# Patient Record
Sex: Male | Born: 1975 | Hispanic: No | Marital: Married | Smoking: Never smoker
Health system: Southern US, Community
[De-identification: ages and names within clinical notes are randomized; demographics above are authoritative.]

---

## 2010-03-31 ENCOUNTER — Ambulatory Visit: Payer: Self-pay | Admitting: Gastroenterology

## 2010-04-04 ENCOUNTER — Observation Stay: Payer: Self-pay | Admitting: Surgery

## 2012-03-05 DIAGNOSIS — I10 Essential (primary) hypertension: Secondary | ICD-10-CM | POA: Insufficient documentation

## 2012-03-05 DIAGNOSIS — L409 Psoriasis, unspecified: Secondary | ICD-10-CM | POA: Insufficient documentation

## 2012-09-12 DIAGNOSIS — M549 Dorsalgia, unspecified: Secondary | ICD-10-CM | POA: Insufficient documentation

## 2014-10-22 ENCOUNTER — Ambulatory Visit: Payer: Self-pay

## 2015-10-12 ENCOUNTER — Other Ambulatory Visit: Payer: Self-pay | Admitting: Infectious Diseases

## 2015-10-12 DIAGNOSIS — B181 Chronic viral hepatitis B without delta-agent: Secondary | ICD-10-CM

## 2015-10-15 ENCOUNTER — Ambulatory Visit
Admission: RE | Admit: 2015-10-15 | Discharge: 2015-10-15 | Disposition: A | Discharge status: Home / Self Care | Payer: 59 | Source: Ambulatory Visit | Attending: Infectious Diseases | Admitting: Infectious Diseases

## 2015-10-15 DIAGNOSIS — B181 Chronic viral hepatitis B without delta-agent: Secondary | ICD-10-CM | POA: Diagnosis present

## 2016-06-17 IMAGING — US US ABDOMEN COMPLETE
1 series · 13 of 25 positions shown · non-contrast
Comparison: Abdominal aortic ultrasound October 22, 2014

CLINICAL DATA: Chronic hepatitis B

EXAM:
ULTRASOUND ABDOMEN COMPLETE

[Series 1: us abdomen complete · 0.20mm/px · 13 of 80 slices shown]
[im 1/80]
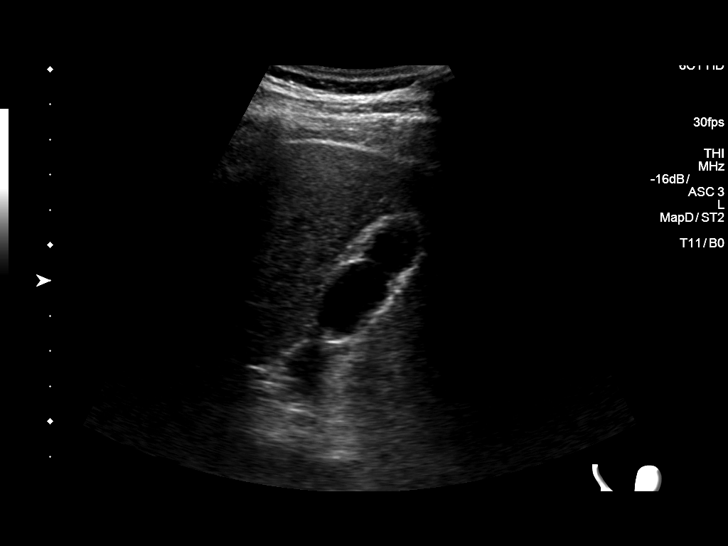
[im 7/80]
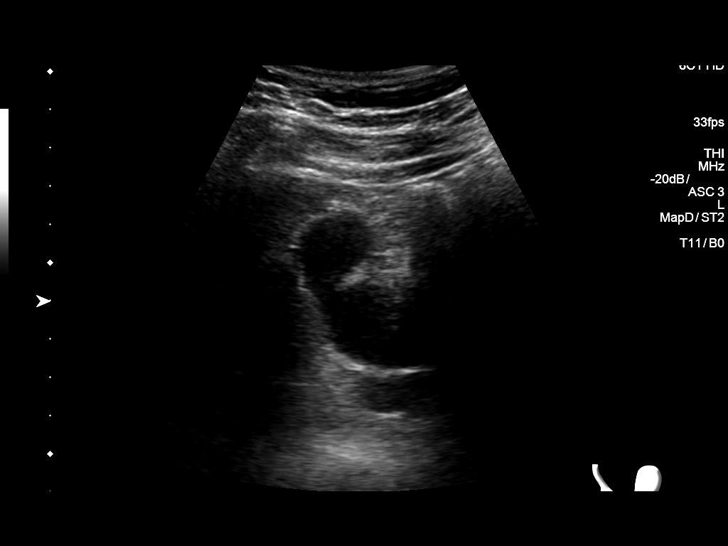
[im 14/80]
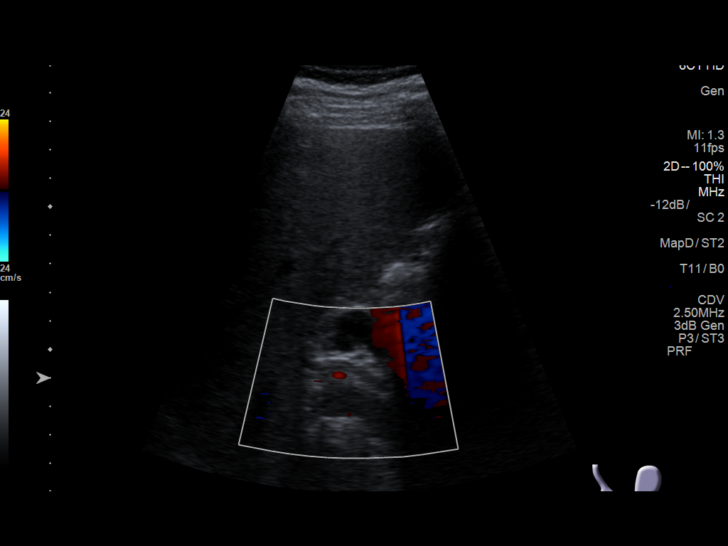
[im 20/80]
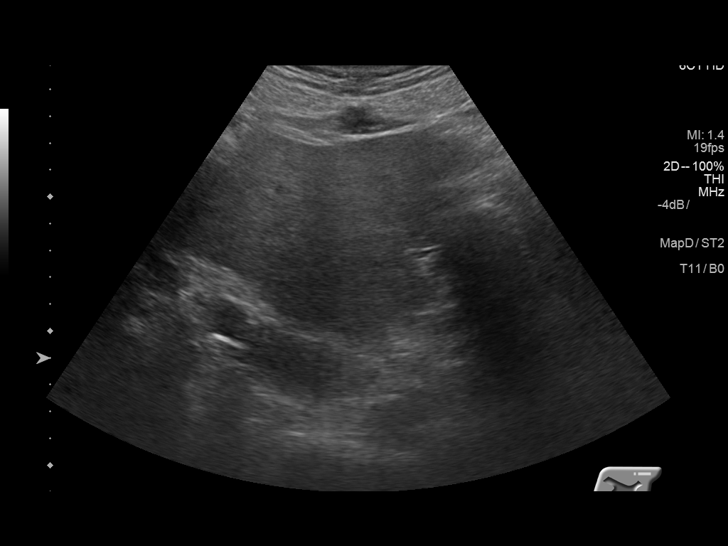
[im 27/80]
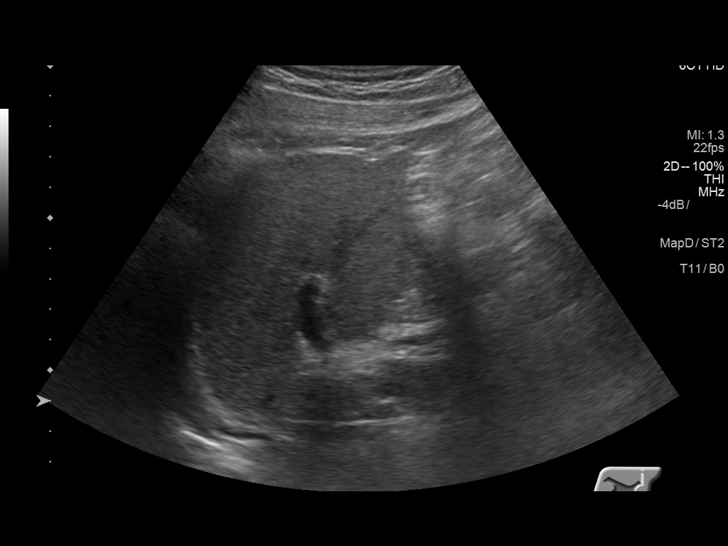
[im 33/80]
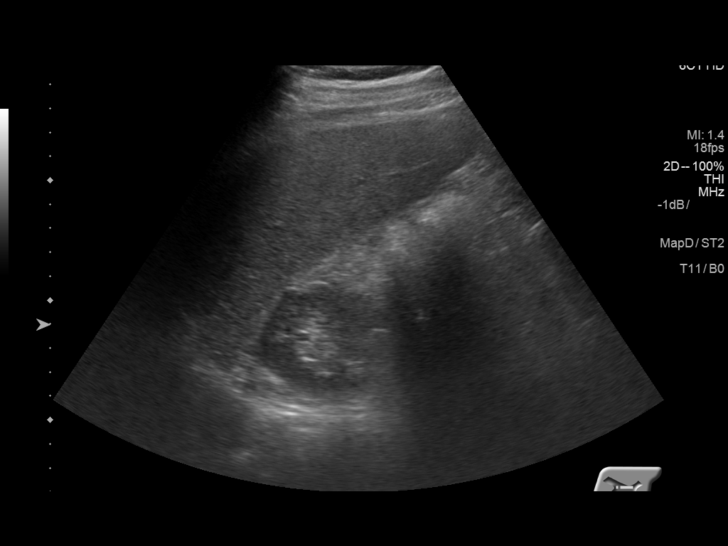
[im 40/80]
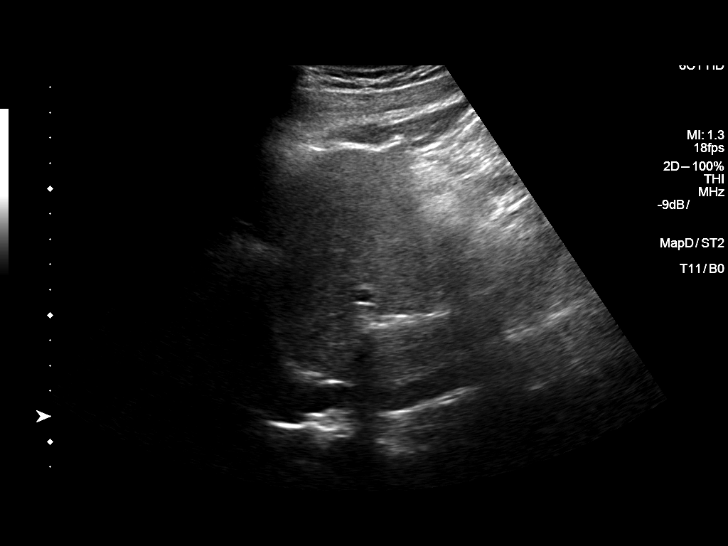
[im 47/80]
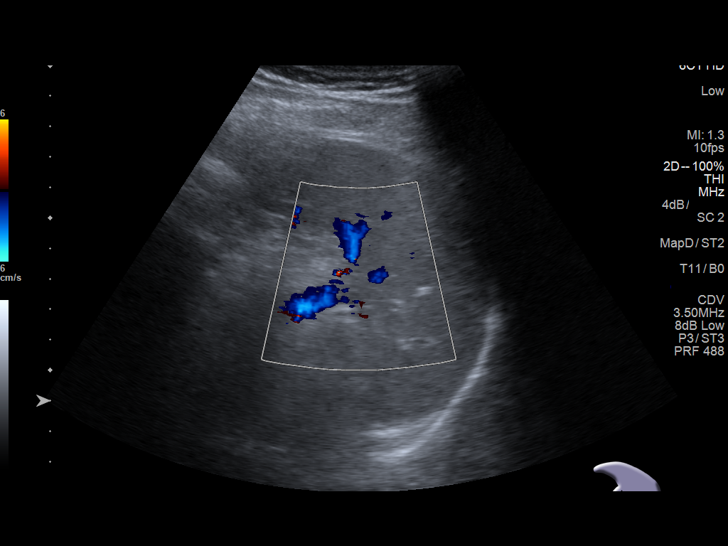
[im 53/80]
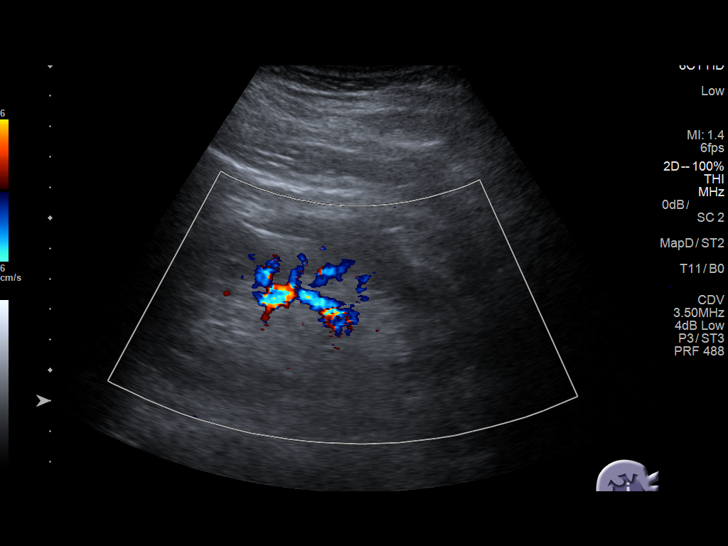
[im 60/80]
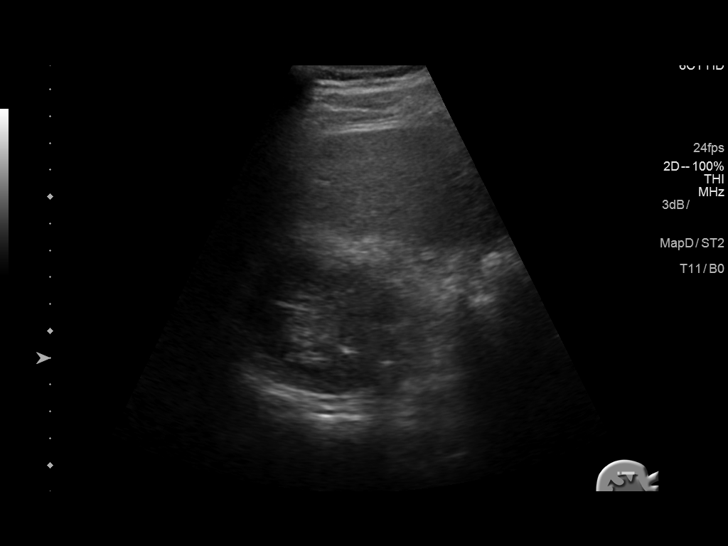
[im 66/80]
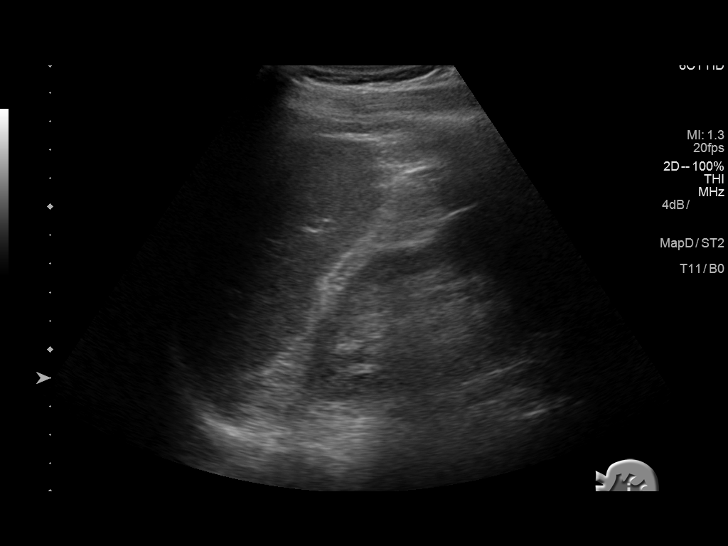
[im 73/80]
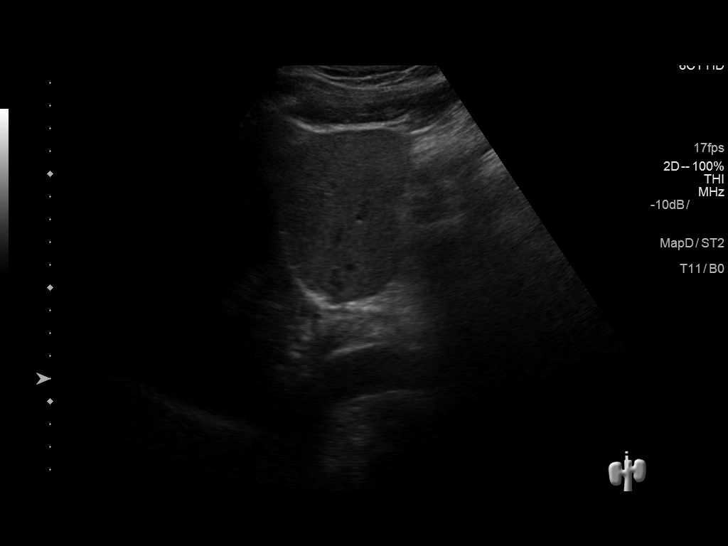
[im 80/80]
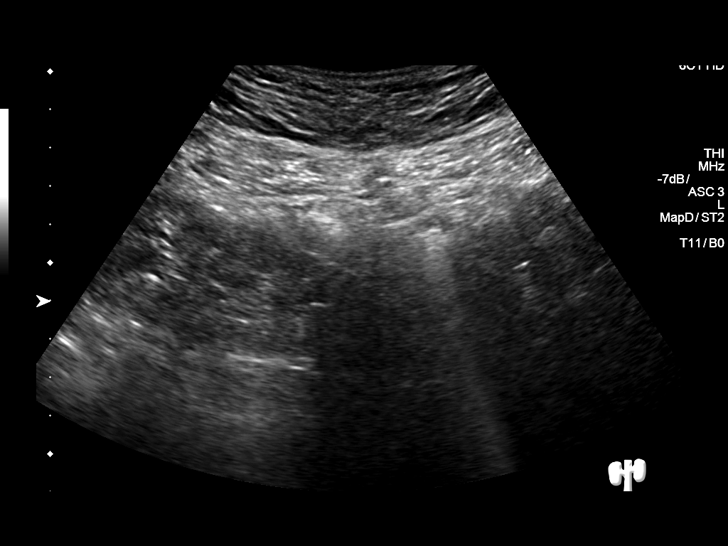

[13 of 25 positions shown; findings below may reference images not displayed]

FINDINGS: Gallbladder: The gallbladder is adequately distended. There are
multiple folds. There is no gallbladder wall thickening,
pericholecystic fluid, or positive sonographic Murphy's sign.

Common bile duct: Diameter: 3.2 mm

Liver: The liver exhibits increased echotexture with no focal mass
nor ductal dilation. The surface contour of the liver remains
smooth.

IVC: No abnormality visualized.

Pancreas: Evaluation of the pancreas was markedly limited due to
bowel gas.

Spleen: Size and appearance within normal limits.

Right Kidney: Length: 11.1 cm. Echogenicity within normal limits. No
mass or hydronephrosis visualized.

Left Kidney: Length: 11.8 cm. Echogenicity within normal limits. No
mass or hydronephrosis visualized. A previously demonstrated upper
pole cyst on the left is not evident today.

Abdominal aorta: Bowel gas limits visualization of portions of the
abdominal aorta. No aneurysm is demonstrated.

Other findings: There is no ascites.
IMPRESSION: 1. Increased hepatic echotexture consistent with fatty infiltrative
change. No hepatic mass is observed.
2. No acute gallbladder or splenic or renal pathology.
3. Limited visualization of the pancreas and abdominal aorta.

## 2016-11-03 ENCOUNTER — Other Ambulatory Visit: Payer: Self-pay | Admitting: Infectious Diseases

## 2016-11-03 DIAGNOSIS — B181 Chronic viral hepatitis B without delta-agent: Secondary | ICD-10-CM

## 2016-11-09 ENCOUNTER — Ambulatory Visit
Admission: RE | Admit: 2016-11-09 | Discharge: 2016-11-09 | Disposition: A | Discharge status: Home / Self Care | Payer: 59 | Source: Ambulatory Visit | Attending: Infectious Diseases | Admitting: Infectious Diseases

## 2016-11-09 DIAGNOSIS — B181 Chronic viral hepatitis B without delta-agent: Secondary | ICD-10-CM | POA: Diagnosis not present

## 2016-11-09 DIAGNOSIS — K760 Fatty (change of) liver, not elsewhere classified: Secondary | ICD-10-CM | POA: Diagnosis not present

## 2017-07-13 IMAGING — US US ABDOMEN LIMITED
1 series · 14 of 25 positions shown · non-contrast
Comparison: 10/15/2015

CLINICAL DATA: Chronic hepatitis-B.  Evaluate for cirrhosis.

EXAM:
US ABDOMEN LIMITED - RIGHT UPPER QUADRANT

[Series 1: us abdomen limited · 0.22mm/px · 14 of 36 slices shown]
[im 1/36]
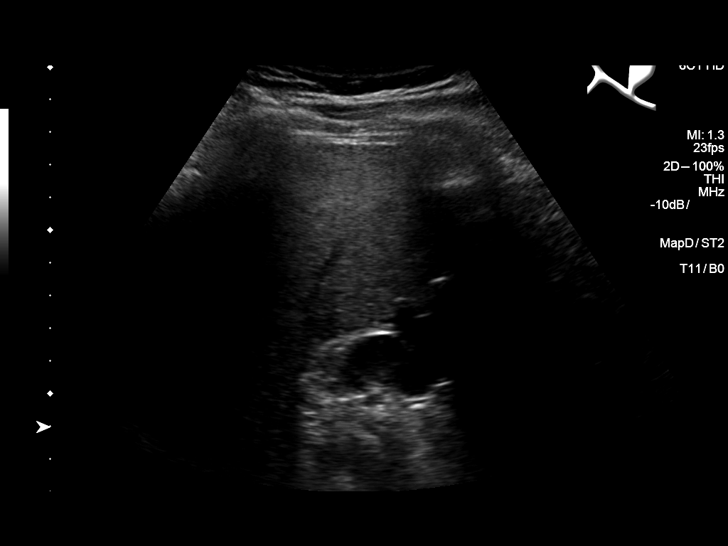
[im 3/36]
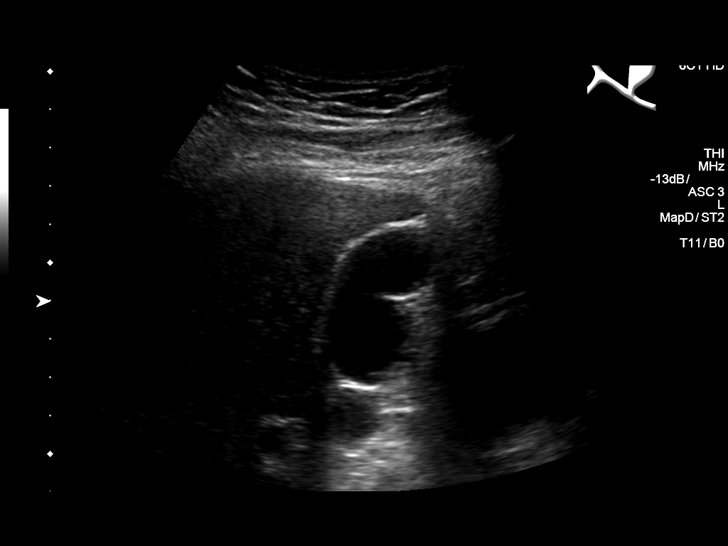
[im 6/36]
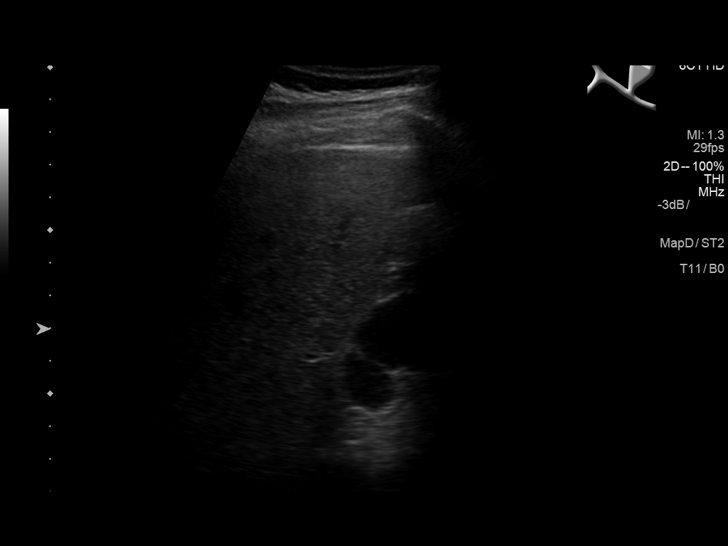
[im 9/36]
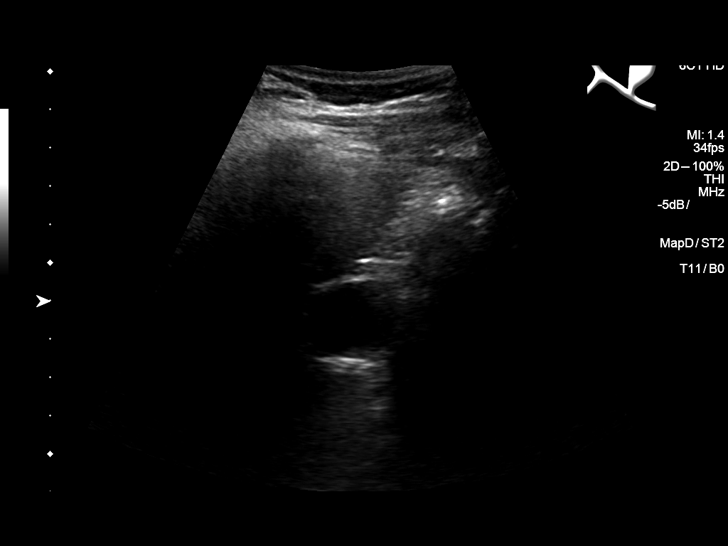
[im 12/36]
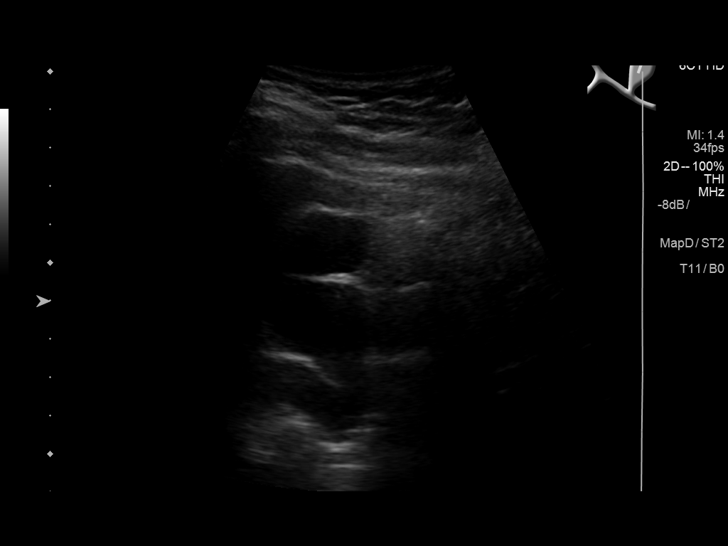
[im 14/36]
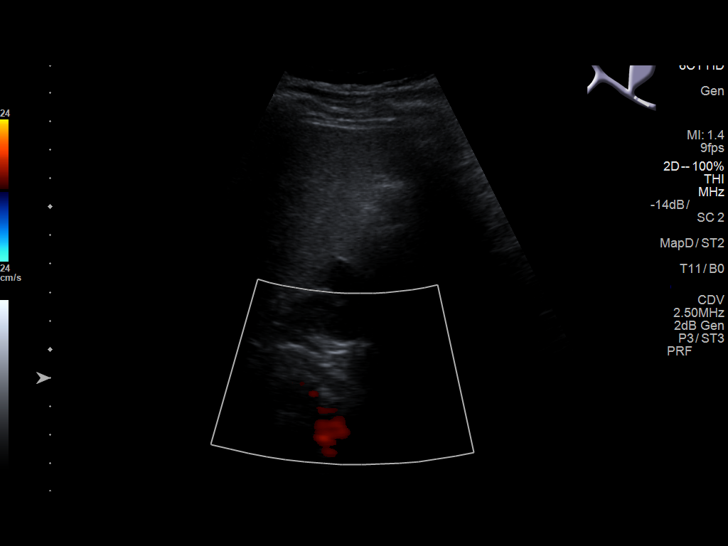
[im 17/36]
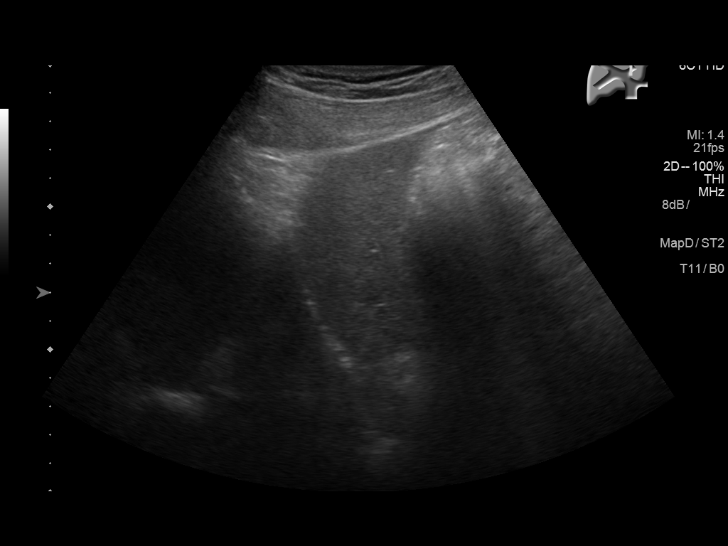
[im 19/36]
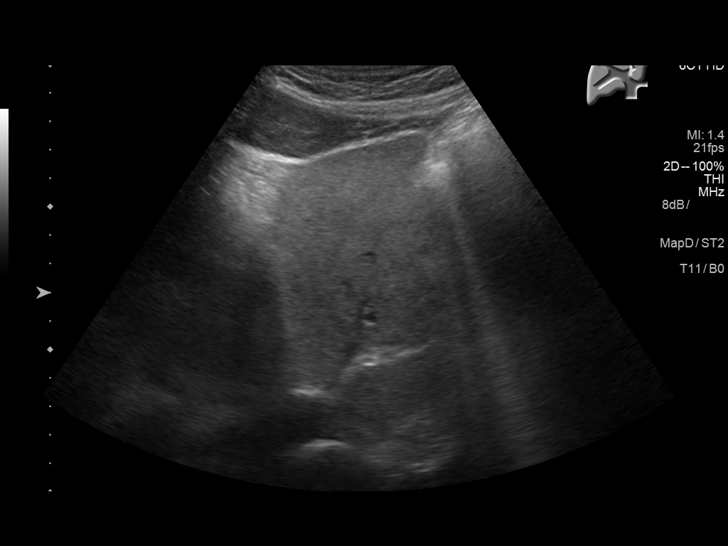
[im 22/36]
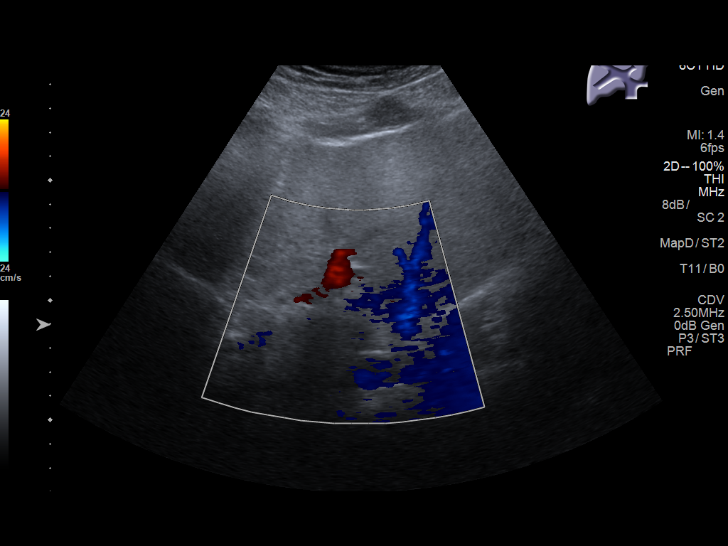
[im 24/36]
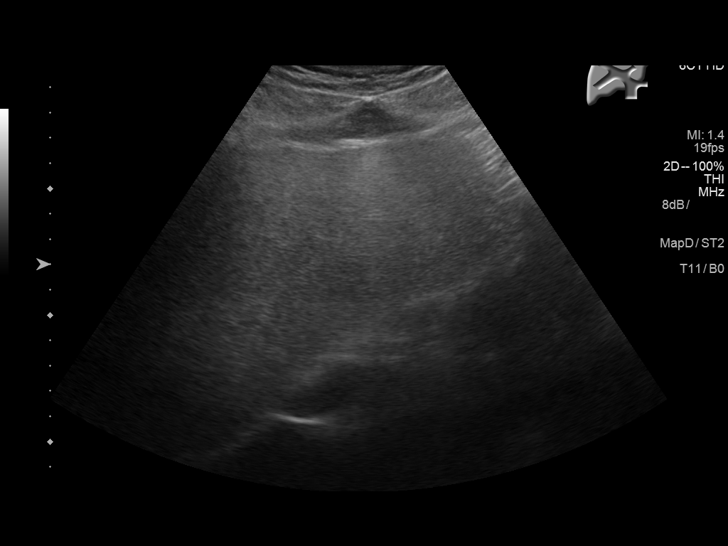
[im 27/36]
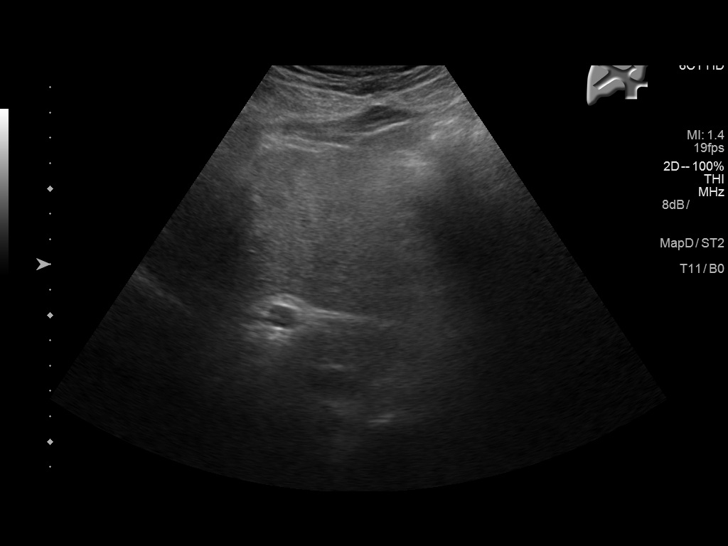
[im 30/36]
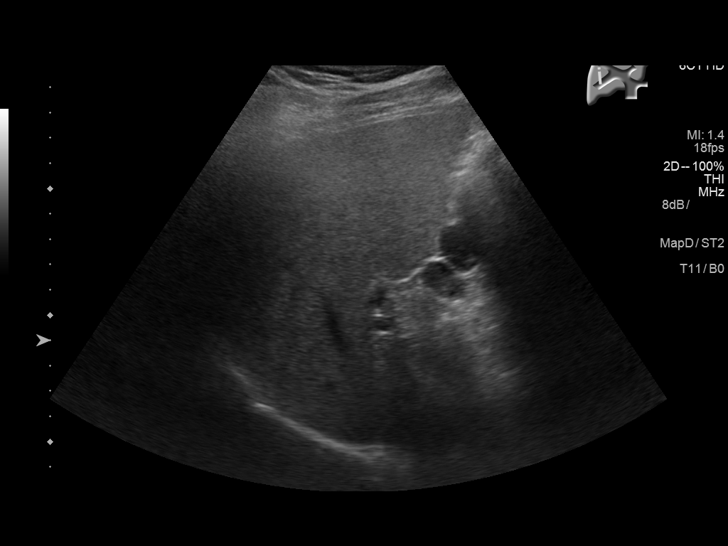
[im 33/36]
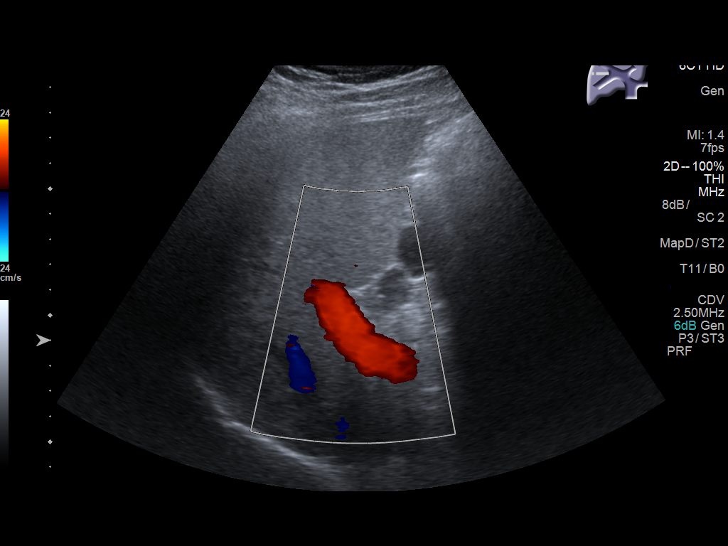
[im 36/36]
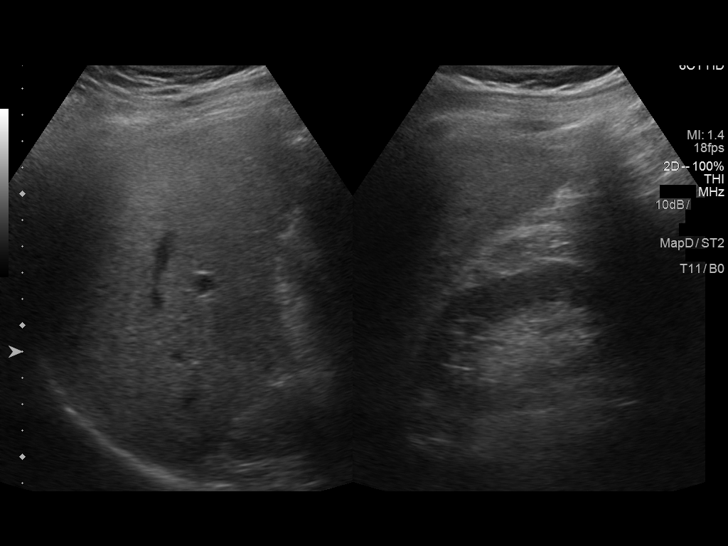

[14 of 25 positions shown; findings below may reference images not displayed]

FINDINGS: Gallbladder:

No gallstones or wall thickening visualized. No sonographic Murphy
sign noted by sonographer.

Common bile duct:

Diameter: Normal caliber, 5 mm

Liver:

Increased echotexture compatible with fatty infiltration. No focal
abnormality or biliary ductal dilatation.
IMPRESSION: Fatty infiltration of the liver.  No focal abnormality.

## 2017-11-16 ENCOUNTER — Other Ambulatory Visit: Payer: Self-pay | Admitting: Infectious Diseases

## 2017-11-16 DIAGNOSIS — D181 Lymphangioma, any site: Secondary | ICD-10-CM

## 2017-12-05 ENCOUNTER — Other Ambulatory Visit: Payer: Self-pay | Admitting: Infectious Diseases

## 2017-12-05 DIAGNOSIS — B181 Chronic viral hepatitis B without delta-agent: Secondary | ICD-10-CM

## 2017-12-07 ENCOUNTER — Ambulatory Visit
Admission: RE | Admit: 2017-12-07 | Discharge: 2017-12-07 | Disposition: A | Discharge status: Home / Self Care | Payer: Managed Care, Other (non HMO) | Source: Ambulatory Visit | Attending: Infectious Diseases | Admitting: Infectious Diseases

## 2017-12-07 DIAGNOSIS — B181 Chronic viral hepatitis B without delta-agent: Secondary | ICD-10-CM

## 2018-08-10 IMAGING — US US ABDOMEN LIMITED
1 series · 14 of 25 positions shown · non-contrast
Comparison: None.

CLINICAL DATA: Hepatitis-B.

EXAM:
ULTRASOUND ABDOMEN LIMITED RIGHT UPPER QUADRANT

[Series 1: us abdomen limited · 0.20mm/px · 14 of 60 slices shown]
[im 1/60]
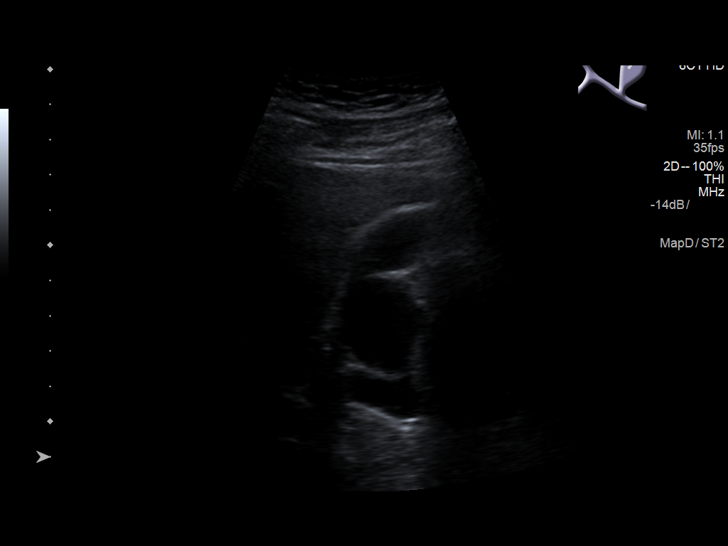
[im 5/60]
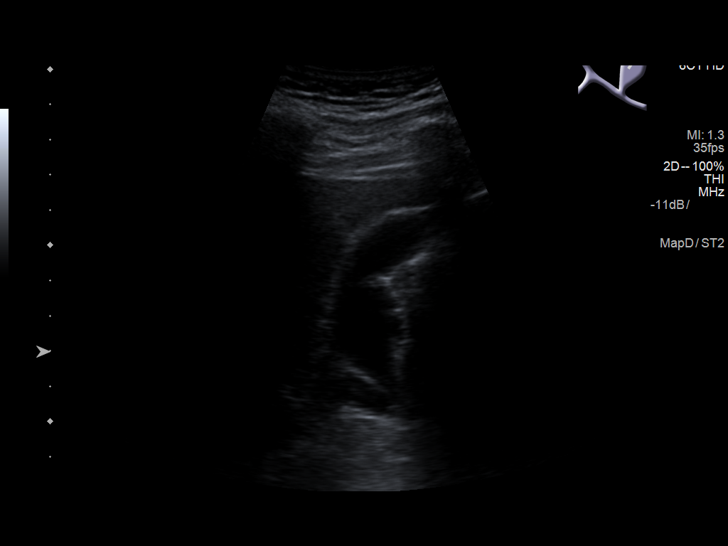
[im 10/60]
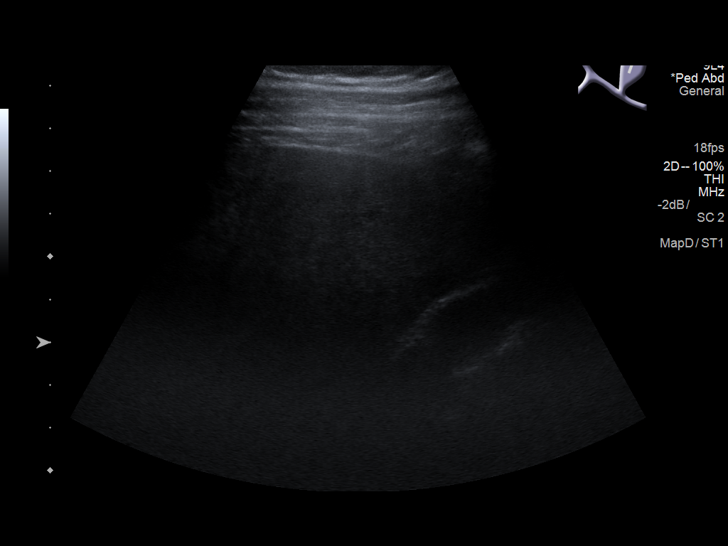
[im 15/60]
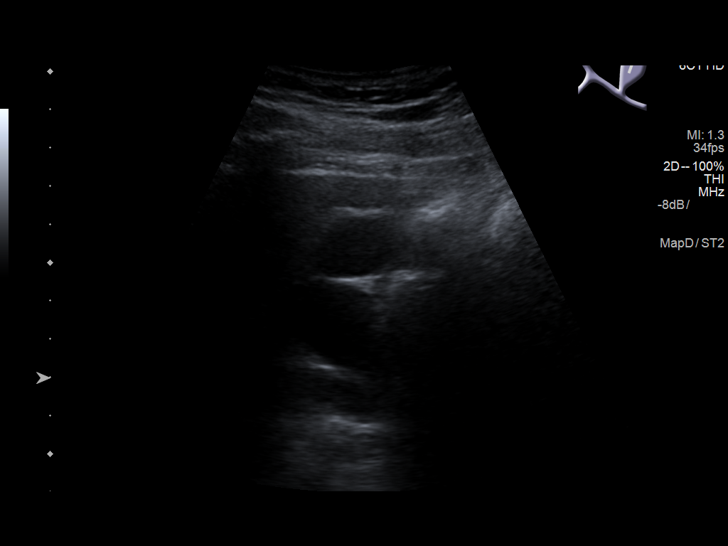
[im 20/60]
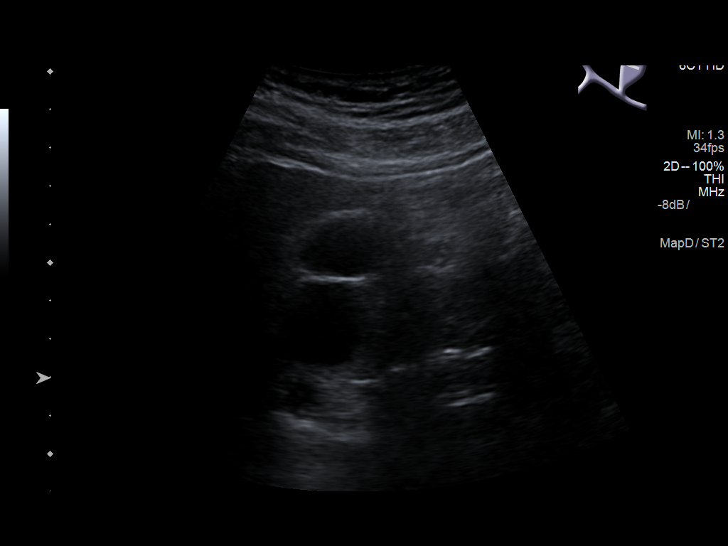
[im 23/60]
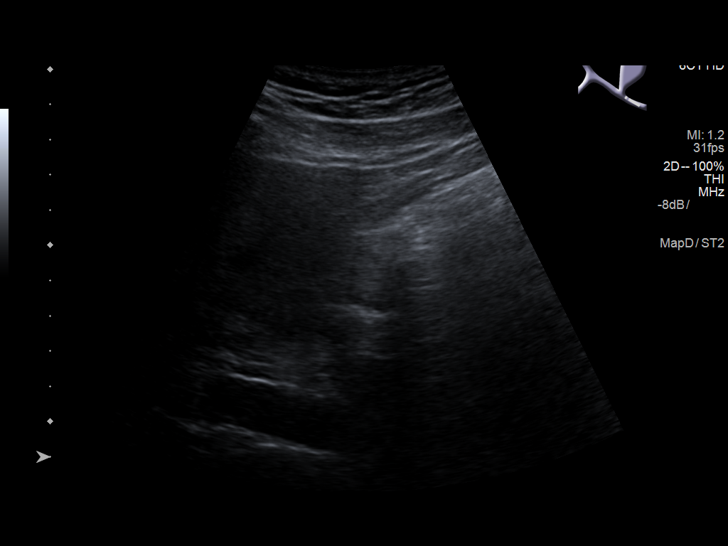
[im 28/60]
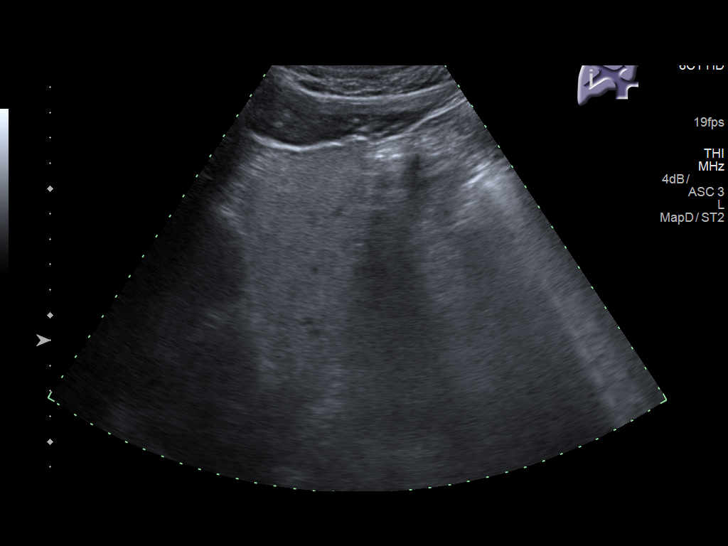
[im 32/60]
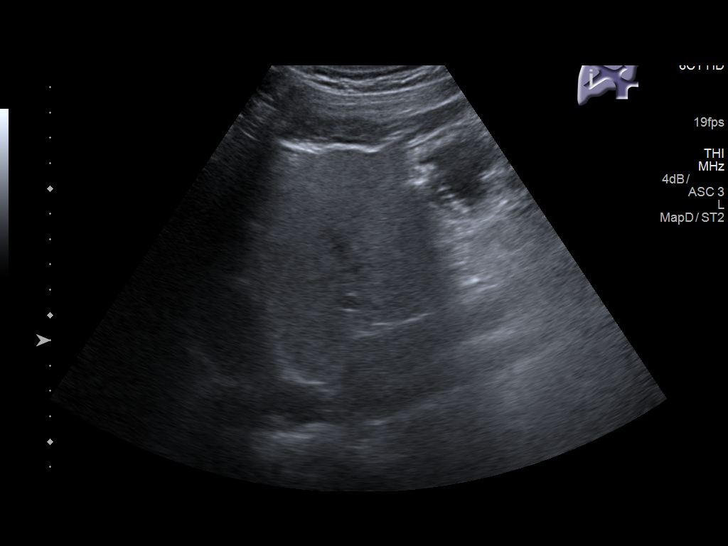
[im 37/60]
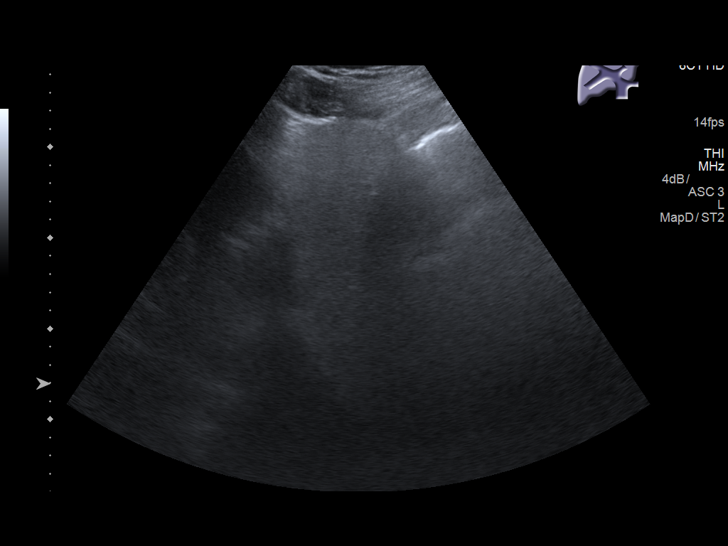
[im 40/60]
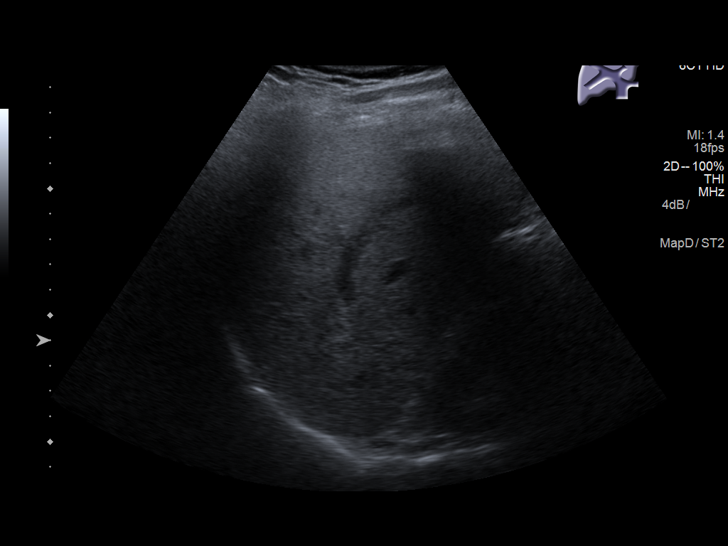
[im 45/60]
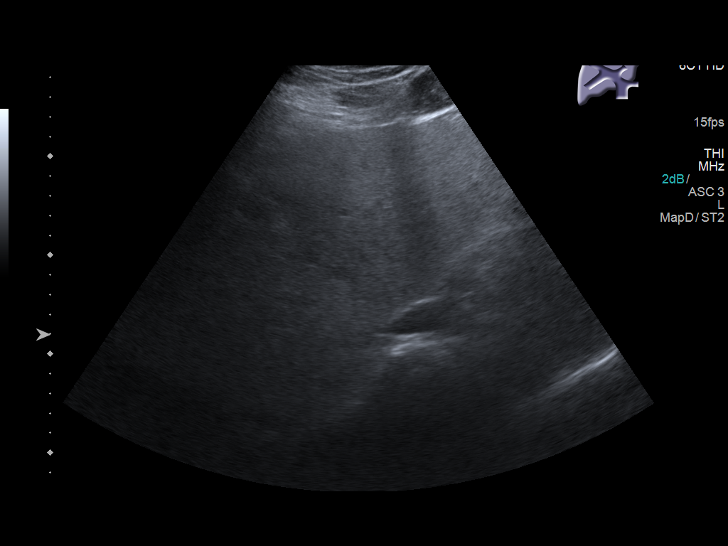
[im 50/60]
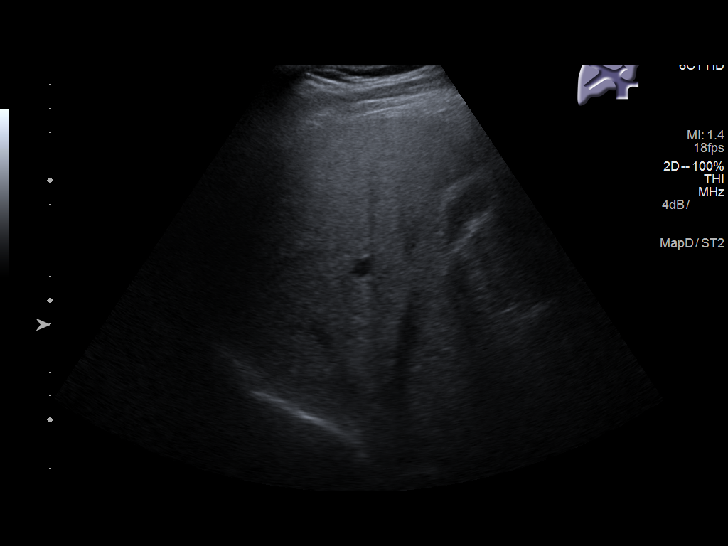
[im 55/60]
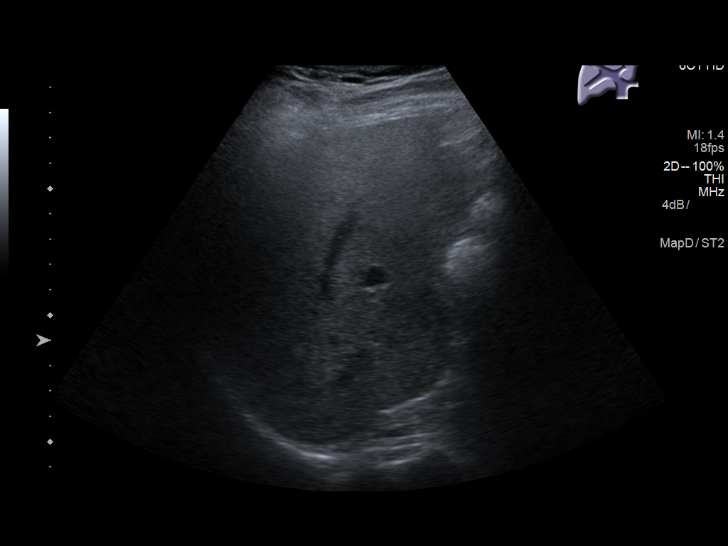
[im 60/60]
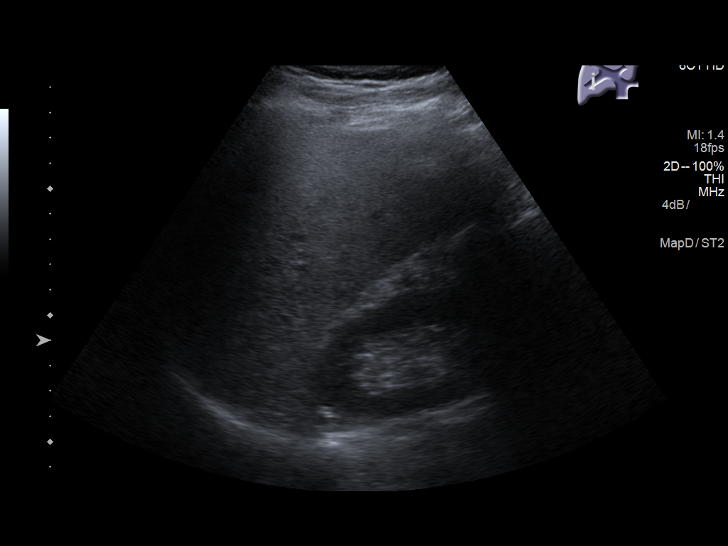

[14 of 25 positions shown; findings below may reference images not displayed]

FINDINGS: Gallbladder:

No gallstones or wall thickening visualized. No sonographic Murphy
sign noted by sonographer.

Common bile duct:

Diameter: 4.3 mm

Liver:

No focal lesion identified. There is diffuse increased echotexture
of the liver. Portal vein is patent on color Doppler imaging with
normal direction of blood flow towards the liver.
IMPRESSION: Diffuse increased echotexture of the liver probably due to fatty
infiltration. No focal lesion is identified.

## 2018-08-20 ENCOUNTER — Encounter: Payer: Self-pay | Admitting: *Deleted

## 2018-09-20 ENCOUNTER — Other Ambulatory Visit: Payer: Self-pay

## 2018-09-20 ENCOUNTER — Ambulatory Visit: Payer: Managed Care, Other (non HMO) | Admitting: Gastroenterology

## 2018-09-20 ENCOUNTER — Encounter: Payer: Self-pay | Admitting: *Deleted

## 2018-09-20 DIAGNOSIS — B181 Chronic viral hepatitis B without delta-agent: Secondary | ICD-10-CM | POA: Insufficient documentation

## 2018-09-20 NOTE — Progress Notes (Deleted)
Gastroenterology Consultation  Referring Provider:     [name], MD Primary Care Physician:  Dakota Pink, MD Primary Gastroenterologist:  Dakota James     Reason for Consultation:     Chronic hepatitis B        HPI:   Dakota James is a 42 y.o. y/o male referred for consultation & management of chronic hepatitis B by Dr. [name], MD.  This patient comes today after being treated in the past by Dakota James and Dakota James for his hepatitis B.  The patient has been treated with entecavir in the past and it appears that he has been on this for many years.  From his last infectious disease note it appears that he was being followed with the intention of converting him from surface antigen positive to surface antigen negative.  The patient has historically been E antigen negative and the antibody positive.  The E antibody positive with the E antibody negative has been present since at least 2016.  The patient is now here to establish care and for following his hepatitis B. The patient's most recent ultrasound in February of this year showed:  IMPRESSION: Diffuse increased echotexture of the liver probably due to fatty infiltration. No focal lesion is identified  The patient's lab work for his hepatitis B showed: Component Name  11/15/2017 11/15/2017 11/01/2016 11/01/2016 10/09/2015   HBs Ag Positive (A)   Positive (A) Positive (A)  HBe Ag Negative   Negative Negative  Hepatitis B Core IgM Negative   Negative Negative  Hep B Core Total Ab Positive (A)   Positive (A) Positive (A)  Hep B Surface Ab Hep B E Ab Positive (A)   Positive (A) Positive (A)  HBV IU/mL Non Reactive   Non Reactive Non Reactive   HBV DNA not detected HBV DNA not detected       No past medical history on file.  *** The histories are not reviewed yet. Please review them in the "History" navigator section and refresh this [place].  Prior to Admission medications   Not on File    No family  history on file.   Social History   Tobacco Use  . Smoking status: Not on file  Substance Use Topics  . Alcohol use: Not on file  . Drug use: Not on file    Allergies as of 09/20/2018  . (Not on File)    Review of Systems:    All systems reviewed and negative except where noted in HPI.   Physical Exam:  There were no vitals taken for this visit. No LMP for male patient. General:   Alert,  Well-developed, ***well-nourished, pleasant and cooperative in NAD Head:  Normocephalic and atraumatic. Eyes:  Sclera clear, no icterus.   Conjunctiva James. Ears:  Normal auditory acuity. Nose:  No deformity, discharge, or lesions. Mouth:  No deformity or lesions,oropharynx James & moist. Neck:  Supple; no masses or thyromegaly. Lungs:  Respirations even and unlabored.  Clear throughout to auscultation.   No wheezes, crackles, or rhonchi. No acute distress. Heart:  Regular rate and rhythm; no murmurs, clicks, rubs, or gallops. Abdomen:  Normal bowel sounds.  No bruits.  Soft, non-tender and non-distended without masses, hepatosplenomegaly or hernias noted.  No guarding or rebound tenderness.  ***Negative Carnett sign.   Rectal:  Deferred.***  Msk:  Symmetrical without gross deformities.  ***Good, equal movement & strength bilaterally. Pulses:  Normal pulses noted. Extremities:  No clubbing or edema.  No  cyanosis. Neurologic:  Alert and oriented x3;  grossly normal neurologically. Skin:  Intact without significant lesions or rashes.  ***No jaundice. Lymph Nodes:  No significant cervical adenopathy. Psych:  Alert and cooperative. Normal mood and affect.  Imaging Studies: No results found.  Assessment and Plan:   Dakota James is a 42 y.o. y/o male ***  Lucilla Lame, MD. Marval Regal    Note: This dictation was prepared with Dragon dictation along with smaller phrase technology. Any transcriptional errors that result from this process are unintentional.

## 2018-10-08 ENCOUNTER — Encounter: Payer: Self-pay | Admitting: Gastroenterology

## 2018-11-08 ENCOUNTER — Encounter: Payer: Self-pay | Admitting: Gastroenterology

## 2018-11-08 ENCOUNTER — Ambulatory Visit: Payer: Managed Care, Other (non HMO) | Admitting: Gastroenterology

## 2018-11-08 ENCOUNTER — Encounter (INDEPENDENT_AMBULATORY_CARE_PROVIDER_SITE_OTHER): Payer: Self-pay

## 2018-11-08 VITALS — BP 131/81 | HR 69 | Ht 67.0 in | Wt 196.6 lb

## 2018-11-08 DIAGNOSIS — B181 Chronic viral hepatitis B without delta-agent: Secondary | ICD-10-CM

## 2018-11-08 NOTE — Patient Instructions (Addendum)
You are scheduled for a RUQ abdominal US at [facility] on Friday, Jan 17th at 8:00am. Please arrive at the medical mall registration desk at 7:45am. You cannot have anything to eat or drink after midnight on Thursday night.   If you need to reschedule this appointment for any reason, please contact central scheduling at [phone].

## 2018-11-08 NOTE — Progress Notes (Signed)
Gastroenterology Consultation  Referring Provider:     [name], MD Primary Care Physician:  [name], MD Primary Gastroenterologist:  Dr. Allen Norris     Reason for Consultation:     Chronic HBV        HPI:   Dakota James is a 43 y.o. y/o male referred for consultation & management of Chronic HBV by Dr. [name], MD.  This patient comes in today after being followed by Dr. Ola Spurr the infectious disease doctor at [facility].  The patient has been followed there for hepatitis B surface antigen positive.  The patient's labs have shown the patient to be hepatitis Be antigen negative with HBe antibody positive.  The patient has also followed up yearly with a negative hepatitis B DNA.  It appears that the patient's liver enzymes have been normal dating back to 2015.  The patient had an ultrasound last year in February for his liver disease and showed diffuse fatty infiltration.  The patient has recently lost 20 pounds for health reasons.  The patient has been treated with entecavir for his hepatitis B.  He has now come to see me for transfer of care since his infectious disease doctor has moved away. The patient does not recall ever being vaccinated for hepatitis A and is not sure if he has been checked for hepatitis A. He states that his father died from hepatitis C and alcoholism.  History reviewed. No pertinent past medical history.  History reviewed. No pertinent surgical history.  Prior to Admission medications   Medication Sig Start Date End Date Taking? Authorizing Provider  entecavir (BARACLUDE) 0.5 MG tablet  08/20/18   [provider]  lisinopril-hydrochlorothiazide (PRINZIDE,ZESTORETIC) 20-25 MG tablet TK 1 T PO ONCE D 08/21/18   [provider]    History reviewed. No pertinent family history.   Social History   Tobacco Use  . Smoking status: Never Smoker  . Smokeless tobacco: Never Used  Substance Use Topics  . Alcohol use:  Never    Frequency: Never  . Drug use: Not on file    Allergies as of 11/08/2018 - Review Complete 11/08/2018  Allergen Reaction Noted  . Amoxicillin-pot clavulanate Nausea Only 04/08/2014    Review of Systems:    All systems reviewed and negative except where noted in HPI.   Physical Exam:  BP 131/81   Pulse 69   Ht 5\' 7"  (1.702 m)   Wt 196 lb 10.1 oz (89.2 kg)   BMI 30.80 kg/m  No LMP for male patient. General:   Alert,  Well-developed, well-nourished, pleasant and cooperative in NAD Head:  Normocephalic and atraumatic. Eyes:  Sclera clear, no icterus.   Conjunctiva pink. Ears:  Normal auditory acuity. Nose:  No deformity, discharge, or lesions. Mouth:  No deformity or lesions,oropharynx pink & moist. Neck:  Supple; no masses or thyromegaly. Lungs:  Respirations even and unlabored.  Clear throughout to auscultation.   No wheezes, crackles, or rhonchi. No acute distress. Heart:  Regular rate and rhythm; no murmurs, clicks, rubs, or gallops. Abdomen:  Normal bowel sounds.  No bruits.  Soft, non-tender and non-distended without masses, hepatosplenomegaly or hernias noted.  No guarding or rebound tenderness.  Negative Carnett sign.   Rectal:  Deferred.  Msk:  Symmetrical without gross deformities.  Good, equal movement & strength bilaterally. Pulses:  Normal pulses noted. Extremities:  No clubbing or edema.  No cyanosis. Neurologic:  Alert and oriented x3;  grossly normal neurologically. Skin:  Intact without significant lesions or rashes.  No jaundice. Lymph Nodes:  No significant cervical adenopathy. Psych:  Alert and cooperative. Normal mood and affect.  Imaging Studies: No results found.  Assessment and Plan:   Dakota James is a 43 y.o. y/o male who has a history of chronic hepatitis B with a negative viral load in the past and fatty liver on ultrasound with a Hbe Antigen Negative and HBe Antibody Positive.  The patient has been doing well on his antiviral medication.   Patient will have his lab sent off and a repeat ultrasound.  He will also have his viral load checked and hepatitis A total antibody to make sure he does not need vaccinations.  The patient has been explained his disease process and surveillance for his hepatitis B.  The patient will follow-up in 1 year.  He will be notified of his results.  Lucilla Lame, MD. Marval Regal    Note: This dictation was prepared with Dragon dictation along with smaller phrase technology. Any transcriptional errors that result from this process are unintentional.

## 2018-11-16 ENCOUNTER — Other Ambulatory Visit
Admission: RE | Admit: 2018-11-16 | Discharge: 2018-11-16 | Disposition: A | Discharge status: Home / Self Care | Payer: Managed Care, Other (non HMO) | Source: Ambulatory Visit | Attending: Gastroenterology | Admitting: Gastroenterology

## 2018-11-16 ENCOUNTER — Ambulatory Visit
Admission: RE | Admit: 2018-11-16 | Discharge: 2018-11-16 | Disposition: A | Discharge status: Home / Self Care | Payer: Managed Care, Other (non HMO) | Source: Ambulatory Visit | Attending: Gastroenterology | Admitting: Gastroenterology

## 2018-11-16 DIAGNOSIS — B181 Chronic viral hepatitis B without delta-agent: Secondary | ICD-10-CM | POA: Diagnosis not present

## 2018-11-16 LAB — CBC WITH DIFFERENTIAL/PLATELET
Abs Immature Granulocytes: 0.02 10*3/uL (ref 0.00–0.07)
Basophils Absolute: 0 10*3/uL (ref 0.0–0.1)
Basophils Relative: 1 %
Eosinophils Absolute: 0.3 10*3/uL (ref 0.0–0.5)
Eosinophils Relative: 5 %
HCT: 46.2 % (ref 39.0–52.0)
Hemoglobin: 14.3 g/dL (ref 13.0–17.0)
Immature Granulocytes: 0 %
LYMPHS PCT: 33 %
Lymphs Abs: 1.7 10*3/uL (ref 0.7–4.0)
MCH: 21 pg — ABNORMAL LOW (ref 26.0–34.0)
MCHC: 31 g/dL (ref 30.0–36.0)
MCV: 67.8 fL — ABNORMAL LOW (ref 80.0–100.0)
Monocytes Absolute: 0.4 10*3/uL (ref 0.1–1.0)
Monocytes Relative: 8 %
Neutro Abs: 2.7 10*3/uL (ref 1.7–7.7)
Neutrophils Relative %: 53 %
Platelets: 201 10*3/uL (ref 150–400)
RBC: 6.81 MIL/uL — ABNORMAL HIGH (ref 4.22–5.81)
RDW: 17.4 % — ABNORMAL HIGH (ref 11.5–15.5)
Smear Review: NORMAL
WBC: 5.2 10*3/uL (ref 4.0–10.5)
nRBC: 0 % (ref 0.0–0.2)

## 2018-11-16 LAB — HEPATIC FUNCTION PANEL
ALK PHOS: 60 U/L (ref 38–126)
ALT: 24 U/L (ref 0–44)
AST: 24 U/L (ref 15–41)
Albumin: 4.6 g/dL (ref 3.5–5.0)
Bilirubin, Direct: <0.1 mg/dL (ref 0.0–0.2)
Total Bilirubin: 0.9 mg/dL (ref 0.3–1.2)
Total Protein: 8.4 g/dL — ABNORMAL HIGH (ref 6.5–8.1)

## 2018-11-16 LAB — PROTIME-INR
INR: 0.99
Prothrombin Time: 13 seconds (ref 11.4–15.2)

## 2018-11-17 LAB — HEPATITIS A ANTIBODY, TOTAL: Hep A Total Ab: POSITIVE — AB

## 2018-11-17 LAB — HEPATITIS B E ANTIGEN: Hep B E Ag: NEGATIVE

## 2018-11-17 LAB — HEPATITIS B SURFACE ANTIGEN

## 2018-11-17 LAB — HEPATITIS B SURFACE ANTIBODY,QUALITATIVE: Hep B S Ab: NONREACTIVE

## 2018-11-17 LAB — HEPATITIS B SURFACE AG, CONFIRM: HBsAg Confirmation: POSITIVE — AB

## 2018-11-18 LAB — HEPATITIS B DNA, ULTRAQUANTITATIVE, PCR
HBV DNA SERPL PCR-ACNC: NOT DETECTED [IU]/mL
HBV DNA SERPL PCR-LOG IU: UNDETERMINED log10 IU/mL

## 2018-11-20 ENCOUNTER — Telehealth: Payer: Self-pay

## 2018-11-20 LAB — HEPATITIS B E ANTIBODY: HEP B E AB: POSITIVE — AB

## 2018-11-20 NOTE — Telephone Encounter (Signed)
-----   Message from Lucilla Lame, MD sent at 11/19/2018 12:13 PM EST ----- Let the patient know that his ultrasound did not show any signs of cancer or growths.  There was some sign of fatty liver which is not uncommon.

## 2018-11-20 NOTE — Telephone Encounter (Signed)
-----   Message from Lucilla Lame, MD sent at 11/19/2018 12:14 PM EST ----- Let the patient know that his blood work did not show any changes with his hepatitis B.  He does not need a vaccination for hepatitis A.  The viral level was undetectable meaning that the medication is working well and his liver enzymes are normal.

## 2018-11-20 NOTE — Telephone Encounter (Signed)
LVM for pt to return my call.

## 2018-11-22 NOTE — Telephone Encounter (Signed)
Left vm again for pt to return my call.  

## 2018-11-23 NOTE — Telephone Encounter (Signed)
Left vm again today for pt to return my call. Will try again on Monday, Jan 27th. If no response, I will mail letter requesting a call back. He does not have a DPR on file to give these results to.

## 2018-11-26 NOTE — Telephone Encounter (Signed)
Pt notified of lab and [place] results.

## 2019-07-20 IMAGING — US US ABDOMEN LIMITED
1 series · 14 of 25 positions shown · non-contrast
Comparison: 12/07/2017

CLINICAL DATA: 42-year-old with chronic hepatitis B.

EXAM:
ULTRASOUND ABDOMEN LIMITED RIGHT UPPER QUADRANT

[Series 1: us abdomen limited · 0.22mm/px · 14 of 33 slices shown]
[im 1/33]
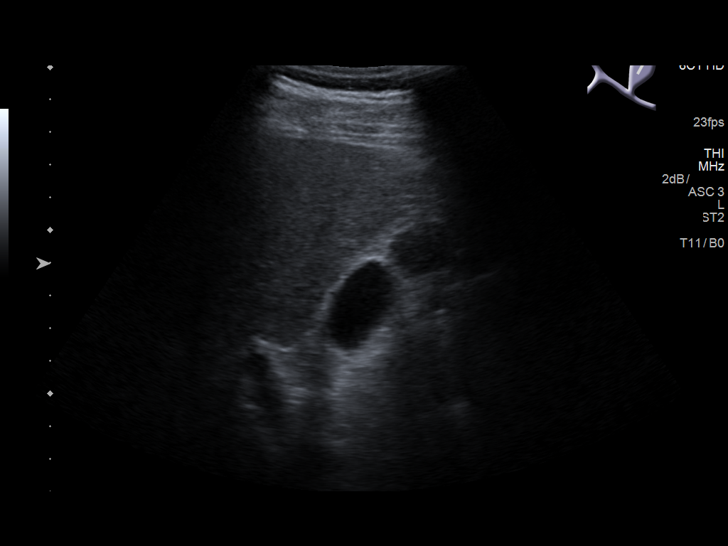
[im 3/33]
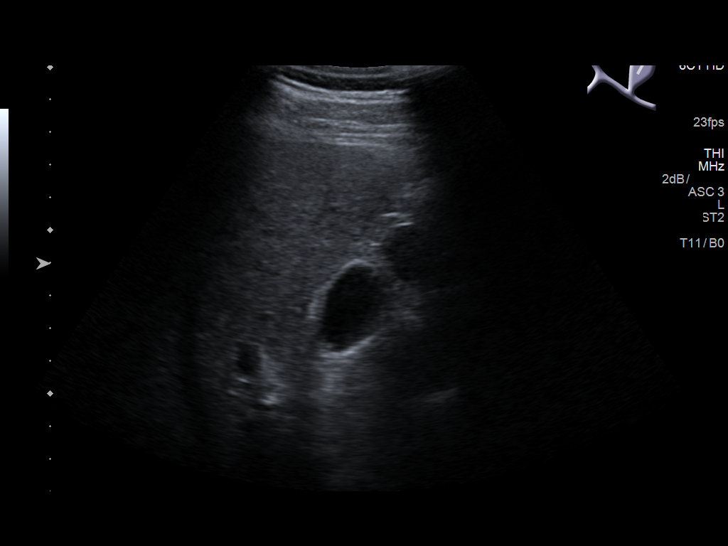
[im 6/33]
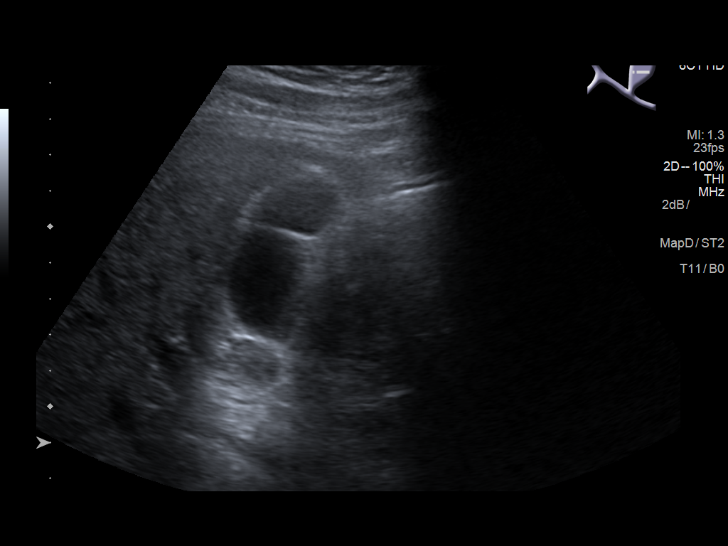
[im 9/33]
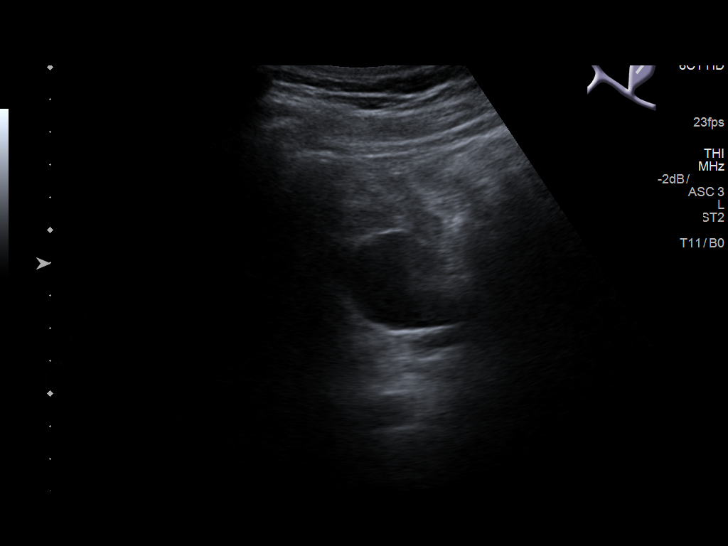
[im 11/33]
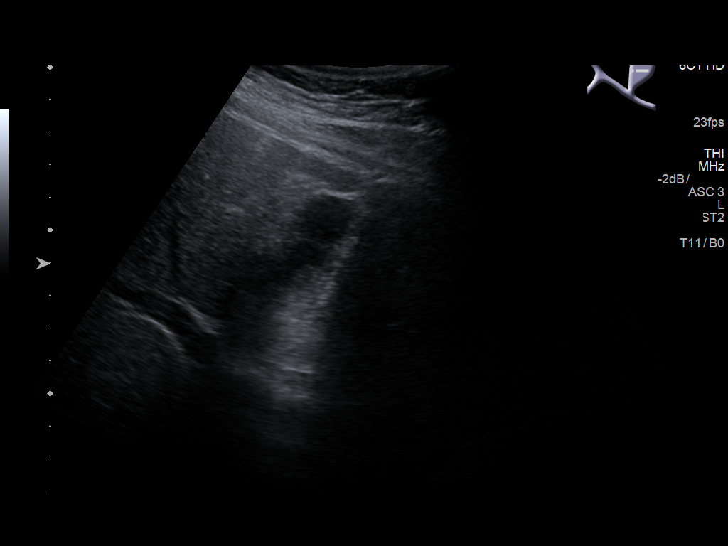
[im 13/33]
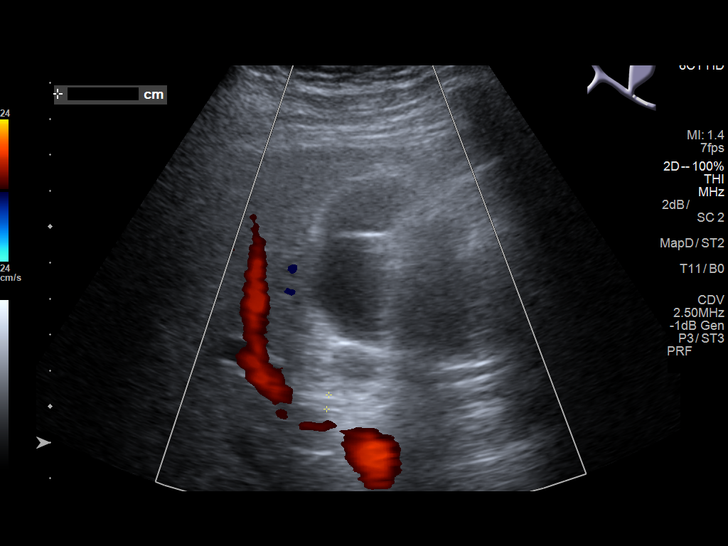
[im 15/33]
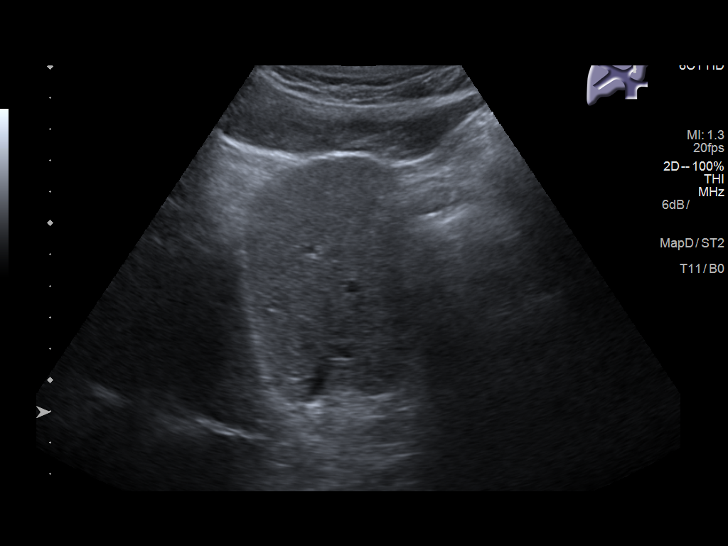
[im 18/33]
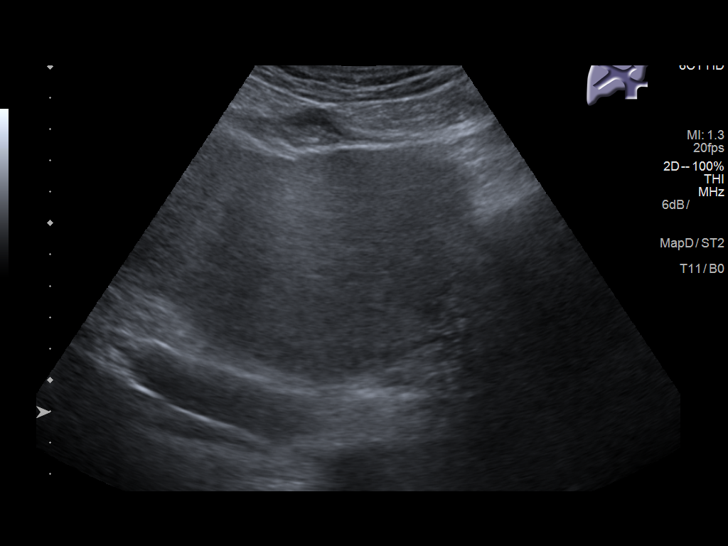
[im 21/33]
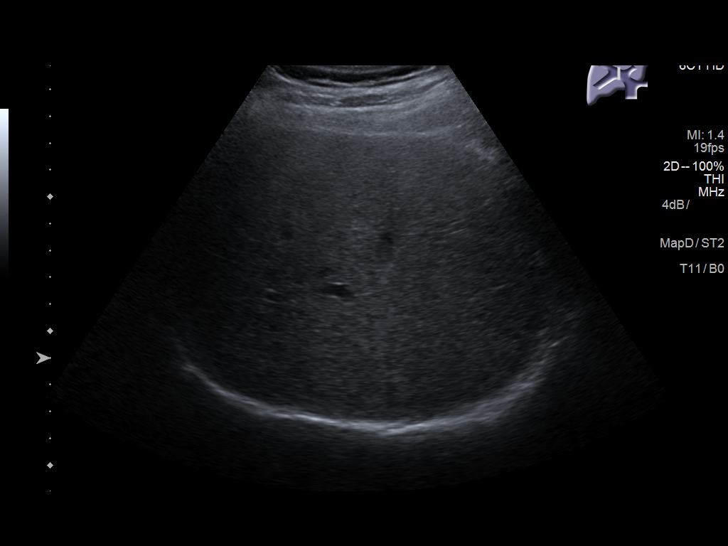
[im 22/33]
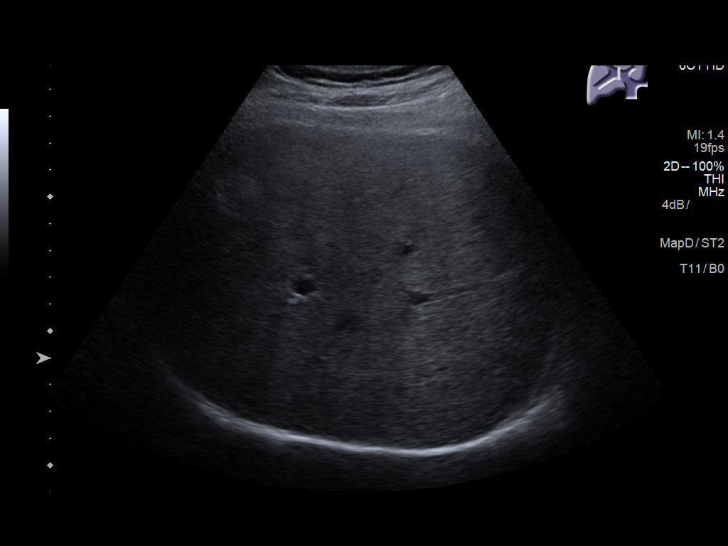
[im 25/33]
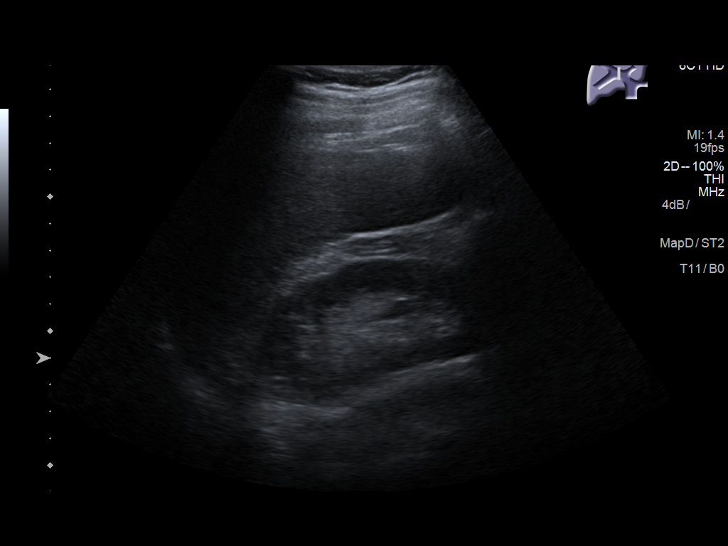
[im 27/33]
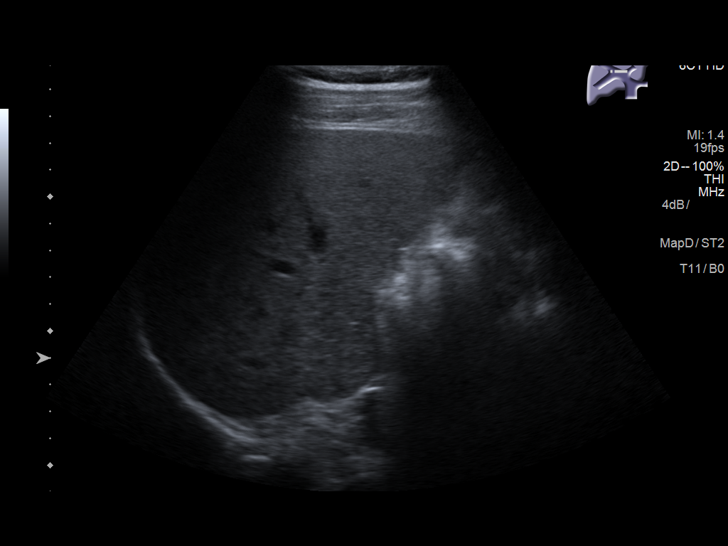
[im 30/33]
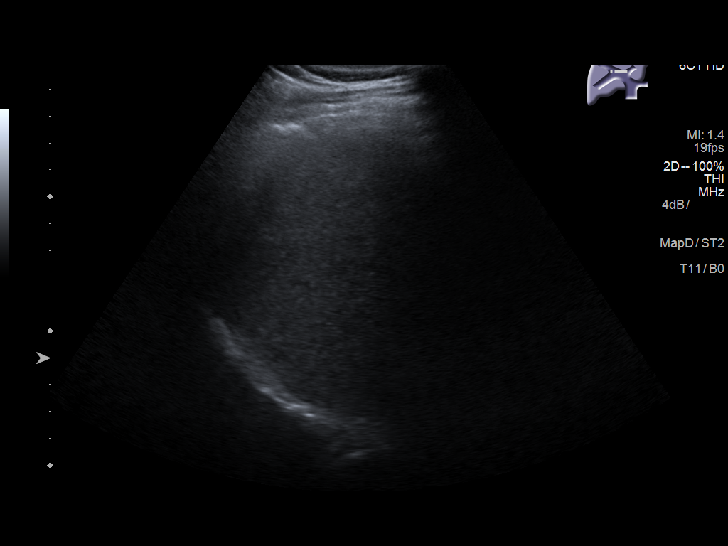
[im 33/33]
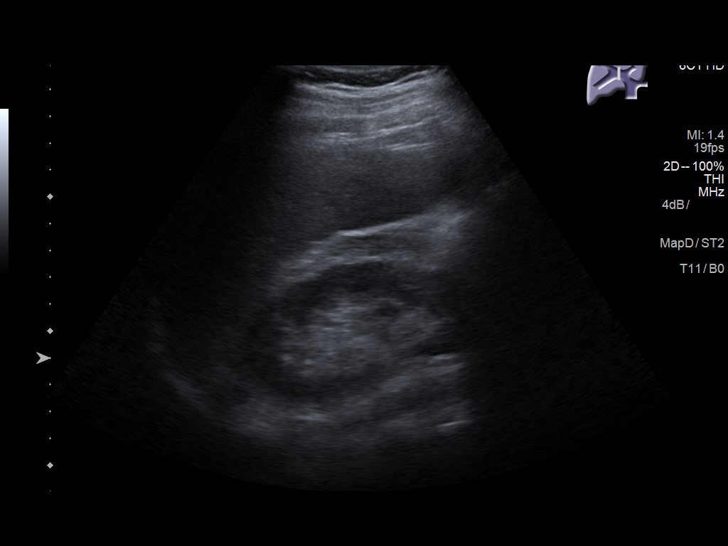

[14 of 25 positions shown; findings below may reference images not displayed]

FINDINGS: Gallbladder:

No gallstones or sludge. Mild wall thickening along the gallbladder
fundus probably related to a fold in the gallbladder and similar to
prior examinations. No pericholecystic fluid. Reportedly, the
patient does not have a sonographic Murphy sign.

Common bile duct:

Diameter: 0.4 cm

Liver:

No focal lesion identified. Slightly increased echogenicity in the
liver parenchyma and similar to prior examinations. Portal vein is
patent on color Doppler imaging with normal direction of blood flow
towards the liver.
IMPRESSION: 1. No acute abnormality. Slightly increased echogenicity in the
liver may be related to fatty infiltration. No focal liver lesion.
2. Mild wall thickening in the gallbladder probably related to
gallbladder fold. No stones or sludge.

## 2019-08-20 DIAGNOSIS — K760 Fatty (change of) liver, not elsewhere classified: Secondary | ICD-10-CM | POA: Insufficient documentation

## 2020-08-21 ENCOUNTER — Other Ambulatory Visit: Payer: Self-pay | Admitting: Infectious Diseases

## 2020-08-21 DIAGNOSIS — K760 Fatty (change of) liver, not elsewhere classified: Secondary | ICD-10-CM

## 2020-08-21 DIAGNOSIS — B181 Chronic viral hepatitis B without delta-agent: Secondary | ICD-10-CM

## 2020-09-16 ENCOUNTER — Ambulatory Visit: Payer: BC Managed Care – PPO

## 2021-03-15 ENCOUNTER — Ambulatory Visit: Payer: BC Managed Care – PPO | Admitting: Internal Medicine

## 2021-03-15 NOTE — Progress Notes (Signed)
Pt canceled his appointment. Office staff will reach out to reschedule.  

## 2021-04-19 ENCOUNTER — Ambulatory Visit: Payer: BC Managed Care – PPO

## 2021-04-19 NOTE — Progress Notes (Unsigned)
Pt rescheduled his appointment.

## 2021-08-24 ENCOUNTER — Other Ambulatory Visit: Payer: Self-pay | Admitting: Infectious Diseases

## 2021-08-24 DIAGNOSIS — B181 Chronic viral hepatitis B without delta-agent: Secondary | ICD-10-CM

## 2021-09-06 ENCOUNTER — Other Ambulatory Visit: Payer: Self-pay

## 2021-09-06 ENCOUNTER — Ambulatory Visit (INDEPENDENT_AMBULATORY_CARE_PROVIDER_SITE_OTHER): Payer: Managed Care, Other (non HMO) | Admitting: Internal Medicine

## 2021-09-06 VITALS — BP 120/73 | HR 80 | Resp 18 | Ht 67.0 in | Wt 197.0 lb

## 2021-09-06 DIAGNOSIS — Z9989 Dependence on other enabling machines and devices: Secondary | ICD-10-CM | POA: Diagnosis not present

## 2021-09-06 DIAGNOSIS — G4733 Obstructive sleep apnea (adult) (pediatric): Secondary | ICD-10-CM

## 2021-09-06 DIAGNOSIS — Z7189 Other specified counseling: Secondary | ICD-10-CM

## 2021-09-06 DIAGNOSIS — I10 Essential (primary) hypertension: Secondary | ICD-10-CM

## 2021-09-06 NOTE — Progress Notes (Signed)
[facility] [place], [place] [address]  Pulmonary Sleep Medicine   Office Visit Note  Patient Name: Dakota James DOB: [birth year] MRN [id number]    Chief Complaint: Obstructive Sleep Apnea visit  Brief History:  Isak is seen today for initial consult to establish care & to get renewal order for supplies.  The patient has a 7 year history of sleep apnea. Original symptoms included snoring, excessive daytime sleepiness, irregular breathing, nonrestorative sleep.  Patient is using PAP nightly 13 cmH2O.  The patient feels fine after sleeping with PAP.  The patient reports benefiting from PAP use. Reported sleepiness is  resolved and the Epworth Sleepiness Score is 14 out of 24. The patient does not take naps. The patient complains of the following: nose drying out, denies oral dryness  The compliance download shows  compliance with an average use time of 6:37  hours @ 97%. The AHI is 1.2  The patient does not complain of limb movements disrupting sleep.  ROS  General: (-) fever, (-) chills, (-) night sweat Nose and Sinuses: (-) nasal stuffiness or itchiness, (-) postnasal drip, (-) nosebleeds, (-) sinus trouble. Mouth and Throat: (-) sore throat, (-) hoarseness. Neck: (-) swollen glands, (-) enlarged thyroid, (-) neck pain. Respiratory: - cough, - shortness of breath, - wheezing. Neurologic: - numbness, - tingling. Psychiatric: - anxiety, - depression   Current Medication: Outpatient Encounter Medications as of 09/06/2021  Medication Sig   entecavir (BARACLUDE) 0.5 MG tablet Take by mouth.   lisinopril-hydrochlorothiazide (ZESTORETIC) 20-25 MG tablet Take 1 tablet by mouth daily.   metFORMIN (GLUCOPHAGE-XR) 500 MG 24 hr tablet Take by mouth.   entecavir (BARACLUDE) 0.5 MG tablet    lisinopril-hydrochlorothiazide (PRINZIDE,ZESTORETIC) 20-25 MG tablet TK 1 T PO ONCE D   No facility-administered encounter medications on file as of 09/06/2021.    Surgical  History: History reviewed. No pertinent surgical history.  Medical History: History reviewed. No pertinent past medical history.  Family History: Non contributory to the present illness  Social History: Social History   Socioeconomic History   Marital status: Married    Spouse name: Not on file   Number of children: Not on file   Years of education: Not on file   Highest education level: Not on file  Occupational History   Not on file  Tobacco Use   Smoking status: Never   Smokeless tobacco: Never  Substance and Sexual Activity   Alcohol use: Never   Drug use: Not on file   Sexual activity: Not on file  Other Topics Concern   Not on file  Social History Narrative   Not on file   Social Determinants of Health   Financial Resource Strain: Not on file  Food Insecurity: Not on file  Transportation Needs: Not on file  Physical Activity: Not on file  Stress: Not on file  Social Connections: Not on file  Intimate Partner Violence: Not on file    Vital Signs: Blood pressure 120/73, pulse 80, resp. rate 18, height 5\' 7"  (1.702 m), weight 197 lb (89.4 kg), SpO2 96 %.  Examination: General Appearance: The patient is well-developed, well-nourished, and in no distress. Neck Circumference: 44 cm Skin: Gross inspection of skin unremarkable. Head: normocephalic, no gross deformities. Eyes: no gross deformities noted. ENT: ears appear grossly normal Neurologic: Alert and oriented. No involuntary movements.    EPWORTH SLEEPINESS SCALE:  Scale:  (0)= no chance of dozing; (1)= slight chance of dozing; (2)= moderate chance of dozing; (  3)= high chance of dozing  Chance  Situtation    Sitting and reading: 2    Watching TV: 2    Sitting Inactive in public: 2    As a passenger in car: 1      Lying down to rest: 2    Sitting and talking: 1    Sitting quielty after lunch: 2    In a car, stopped in traffic: 2   TOTAL SCORE:   14 out of 24    SLEEP  STUDIES:  PSG (06/01/13) AHI of 84/hr ; min SpO2  63%   CPAP COMPLIANCE DATA:  Date Range: 09/01/20 - 08/31/21  Average Daily Use: 6:37 hours  Median Use: 6:34 cmH2O  Compliance for > 4 Hours: 97%  AHI: 1.2 respiratory events per hour  Days Used: 361/365days  Mask Leak: 38.2 lpm  95th Percentile Pressure: 13 cmH2o    LABS: No results found for this or any previous visit (from the past 2160 hour(s)).  Radiology: US ABDOMEN LIMITED RUQ  Result Date: 11/16/2018 CLINICAL DATA:  45 year old with chronic hepatitis B. EXAM: ULTRASOUND ABDOMEN LIMITED RIGHT UPPER QUADRANT COMPARISON:  12/07/2017 FINDINGS: Gallbladder: No gallstones or sludge. Mild wall thickening along the gallbladder fundus probably related to a fold in the gallbladder and similar to prior examinations. No pericholecystic fluid. Reportedly, the patient does not have a sonographic Murphy sign. Common bile duct: Diameter: 0.4 cm Liver: No focal lesion identified. Slightly increased echogenicity in the liver parenchyma and similar to prior examinations. Portal vein is patent on color Doppler imaging with normal direction of blood flow towards the liver. IMPRESSION: 1. No acute abnormality. Slightly increased echogenicity in the liver may be related to fatty infiltration. No focal liver lesion. 2. Mild wall thickening in the gallbladder probably related to gallbladder fold. No stones or sludge. Electronically Signed   By: Markus Daft M.D.   On: 11/16/2018 10:44    No results found.  No results found.    Assessment and Plan: Patient Active Problem List   Diagnosis Date Noted   Chronic hepatitis B virus infection ([place]) 09/20/2018   Back pain 09/12/2012   Hypertension 03/05/2012   Psoriasis 03/05/2012    1. OSA on CPAP The patient does tolerate PAP and reports  benefit from PAP use. The patient was reminded how to clean equipment and advised to replace supplies routinely. His Epworth is high but he was recently  diagnosed with uncontrolled diabetes with an a1c of 10. He declines a workup/MSLT for now. The patient was also counselled on weight loss. The compliance is excellent . The AHI is 1.2.   OSA- continue with excellent compliance with pap. F/u one year. Consider MSLT if sleepiness persists.    2. CPAP use counseling CPAP Counseling: had a lengthy discussion with the patient regarding the importance of PAP therapy in management of the sleep apnea. Patient appears to understand the risk factor reduction and also understands the risks associated with untreated sleep apnea. Patient will try to make a good faith effort to remain compliant with therapy. Also instructed the patient on proper cleaning of the device including the water must be changed daily if possible and use of distilled water is preferred. Patient understands that the machine should be regularly cleaned with appropriate recommended cleaning solutions that do not damage the PAP machine for example given white vinegar and water rinses. Other methods such as ozone treatment may not be as good as these simple methods to achieve cleaning.  3. Hypertension, unspecified type Hypertension Counseling:   The following hypertensive lifestyle modification were recommended and discussed:  1. Limiting alcohol intake to less than 1 oz/day of ethanol:(24 oz of beer or 8 oz of wine or 2 oz of 100-proof whiskey). 2. Take baby ASA 81 mg daily. 3. Importance of regular aerobic exercise and losing weight. 4. Reduce dietary saturated fat and cholesterol intake for overall cardiovascular health. 5. Maintaining adequate dietary potassium, calcium, and magnesium intake. 6. Regular monitoring of the blood pressure. 7. Reduce sodium intake to less than 100 mmol/day (less than 2.3 gm of sodium or less than 6 gm of sodium choride)     General Counseling: I have discussed the findings of the evaluation and examination with Lennette Bihari.  I have also discussed any further  diagnostic evaluation thatmay be needed or ordered today. Tra verbalizes understanding of the findings of todays visit. We also reviewed his medications today and discussed drug interactions and side effects including but not limited excessive drowsiness and altered mental states. We also discussed that there is always a risk not just to him but also people around him. he has been encouraged to call the office with any questions or concerns that should arise related to todays visit.  No orders of the defined types were placed in this encounter.       I have personally obtained a history, examined the patient, evaluated laboratory and imaging results, formulated the assessment and plan and placed orders. This patient was seen today by Tressie Ellis, PA-C in collaboration with Dr. Devona Konig.   Allyne Gee, MD [facility] Diplomate ABMS Pulmonary Critical Care Medicine and Sleep Medicine

## 2021-09-06 NOTE — Patient Instructions (Signed)

## 2022-03-03 DIAGNOSIS — E1165 Type 2 diabetes mellitus with hyperglycemia: Secondary | ICD-10-CM | POA: Insufficient documentation

## 2022-12-02 ENCOUNTER — Other Ambulatory Visit: Payer: Self-pay | Admitting: Infectious Diseases

## 2022-12-02 DIAGNOSIS — D649 Anemia, unspecified: Secondary | ICD-10-CM

## 2022-12-02 DIAGNOSIS — E1165 Type 2 diabetes mellitus with hyperglycemia: Secondary | ICD-10-CM

## 2022-12-02 DIAGNOSIS — B181 Chronic viral hepatitis B without delta-agent: Secondary | ICD-10-CM

## 2022-12-16 ENCOUNTER — Ambulatory Visit: Payer: BC Managed Care – PPO

## 2022-12-30 ENCOUNTER — Ambulatory Visit: Admission: RE | Admit: 2022-12-30 | Payer: 59 | Source: Ambulatory Visit

## 2023-12-06 ENCOUNTER — Other Ambulatory Visit: Payer: Self-pay | Admitting: Infectious Diseases

## 2023-12-06 DIAGNOSIS — Z794 Long term (current) use of insulin: Secondary | ICD-10-CM

## 2023-12-06 DIAGNOSIS — D649 Anemia, unspecified: Secondary | ICD-10-CM

## 2023-12-06 DIAGNOSIS — I10 Essential (primary) hypertension: Secondary | ICD-10-CM

## 2023-12-06 DIAGNOSIS — E785 Hyperlipidemia, unspecified: Secondary | ICD-10-CM

## 2023-12-06 DIAGNOSIS — B181 Chronic viral hepatitis B without delta-agent: Secondary | ICD-10-CM

## 2024-01-02 ENCOUNTER — Ambulatory Visit
Admission: RE | Admit: 2024-01-02 | Discharge: 2024-01-02 | Disposition: A | Discharge status: Home / Self Care | Payer: 59 | Source: Ambulatory Visit | Attending: Infectious Diseases | Admitting: Infectious Diseases

## 2024-01-02 DIAGNOSIS — D649 Anemia, unspecified: Secondary | ICD-10-CM | POA: Insufficient documentation

## 2024-01-02 DIAGNOSIS — E785 Hyperlipidemia, unspecified: Secondary | ICD-10-CM | POA: Insufficient documentation

## 2024-01-02 DIAGNOSIS — I10 Essential (primary) hypertension: Secondary | ICD-10-CM | POA: Diagnosis present

## 2024-01-02 DIAGNOSIS — E1165 Type 2 diabetes mellitus with hyperglycemia: Secondary | ICD-10-CM | POA: Insufficient documentation

## 2024-01-02 DIAGNOSIS — Z794 Long term (current) use of insulin: Secondary | ICD-10-CM | POA: Diagnosis present

## 2024-01-02 DIAGNOSIS — B181 Chronic viral hepatitis B without delta-agent: Secondary | ICD-10-CM | POA: Insufficient documentation

## 2024-09-29 NOTE — Progress Notes (Unsigned)
 [address], [place] [address]  Pulmonary Sleep Medicine   Office Visit Note  Patient Name: Dakota James DOB: July 05, 1976 MRN [id number]    Chief Complaint: Obstructive Sleep Apnea visit  Brief History:  [name] is seen today for an initial consult to re-establish care and for CPAP@ 13 cmH2O. The patient has a 11 year history of sleep apnea. Patient is using PAP nightly.  The patient feels rested after sleeping with PAP.  The patient reports benefiting from PAP use. Reported sleepiness is  improved and the Epworth Sleepiness Score is 3 out of 24. The patient does not take naps. The patient complains of the following: Some bloating as he recently lost some weight. Patient is in need of replacement supplies.  The compliance download shows  81% compliance with an average use time of 5 hours 35 minutes. The AHI is 1.1.  The patient occasionally complains  of limb movements disrupting sleep. The patient continues to require PAP therapy in order to eliminate sleep apnea.   ROS  General: (-) fever, (-) chills, (-) night sweat Nose and Sinuses: (-) nasal stuffiness or itchiness, (-) postnasal drip, (-) nosebleeds, (-) sinus trouble. Mouth and Throat: (-) sore throat, (-) hoarseness. Neck: (-) swollen glands, (-) enlarged thyroid, (-) neck pain. Respiratory: - cough, - shortness of breath, - wheezing. Neurologic: - numbness, - tingling. Psychiatric: - anxiety, - depression   Current Medication: Outpatient Encounter Medications as of 09/30/2024  Medication Sig   atorvastatin (LIPITOR) 10 MG tablet Take 10 mg by mouth daily.   entecavir (BARACLUDE) 0.5 MG tablet    lisinopril-hydrochlorothiazide (PRINZIDE,ZESTORETIC) 20-25 MG tablet TK 1 T PO ONCE D   lisinopril-hydrochlorothiazide (ZESTORETIC) 20-25 MG tablet Take 1 tablet by mouth daily.   metFORMIN (GLUCOPHAGE-XR) 500 MG 24 hr tablet Take by mouth.   MOUNJARO 10 MG/0.5ML Pen SMARTSIG:10 Milligram(s) SUB-Q  Once a Week   No facility-administered encounter medications on file as of 09/30/2024.    Surgical History: No past surgical history on file.  Medical History: No past medical history on file.  Family History: Non contributory to the present illness  Social History: Social History   Socioeconomic History   Marital status: Married    Spouse name: Not on file   Number of children: Not on file   Years of education: Not on file   Highest education level: Not on file  Occupational History   Not on file  Tobacco Use   Smoking status: Never   Smokeless tobacco: Never  Substance and Sexual Activity   Alcohol use: Never   Drug use: Not on file   Sexual activity: Not on file  Other Topics Concern   Not on file  Social History Narrative   Not on file   Social Drivers of Health   Financial Resource Strain: Low Risk  (07/04/2024)   Received from [facility]   Overall Financial Resource Strain (CARDIA)    Difficulty of Paying Living Expenses: Not hard at all  Food Insecurity: No Food Insecurity (07/04/2024)   Received from [facility]   Hunger Vital Sign    Within the past 12 months, you worried that your food would run out before you got the money to buy more.: Never true    Within the past 12 months, the food you bought just didn't last and you didn't have money to get more.: Never true  Transportation Needs: No Transportation Needs (07/04/2024)   Received from  [facility]   PRAPARE - Transportation    In the past 12 months, has lack of transportation kept you from medical appointments or from getting medications?: No    Lack of Transportation (Non-Medical): No  Physical Activity: Not on file  Stress: Not on file  Social Connections: Unknown (03/14/2022)   Received from [facility]   Social Network    Social Network: Not on file  Intimate Partner Violence: Unknown (02/03/2022)   Received from [facility]   HITS     Physically Hurt: Not on file    Insult or Talk Down To: Not on file    Threaten Physical Harm: Not on file    Scream or Curse: Not on file    Vital Signs: Blood pressure 110/76, pulse 80, resp. rate 16, height 5' 7 (1.702 m), weight 172 lb (78 kg), SpO2 98%. Body mass index is 26.94 kg/m.    Examination: General Appearance: The patient is well-developed, well-nourished, and in no distress. Neck Circumference: 37 cm Skin: Gross inspection of skin unremarkable. Head: normocephalic, no gross deformities. Eyes: no gross deformities noted. ENT: ears appear grossly normal Neurologic: Alert and oriented. No involuntary movements.  STOP BANG RISK ASSESSMENT S (snore) Have you been told that you snore?     NO   T (tired) Are you often tired, fatigued, or sleepy during the day?   NO  O (obstruction) Do you stop breathing, choke, or gasp during sleep? NO   P (pressure) Do you have or are you being treated for high blood pressure? YES   B (BMI) Is your body index greater than 35 kg/m? NO   A (age) Are you 28 years old or older? YES   N (neck) Do you have a neck circumference greater than 16 inches?   NO   G (gender) Are you a male? YES   TOTAL STOP/BANG "YES" ANSWERS 3       A STOP-Bang score of 2 or less is considered low risk, and a score of 5 or more is high risk for having either moderate or severe OSA. For people who score 3 or 4, doctors may need to perform further assessment to determine how likely they are to have OSA.         EPWORTH SLEEPINESS SCALE:  Scale:  (0)= no chance of dozing; (1)= slight chance of dozing; (2)= moderate chance of dozing; (3)= high chance of dozing  Chance  Situtation    Sitting and reading: 1    Watching TV: 1    Sitting Inactive in public: 0    As a passenger in car: 1      Lying down to rest: 0    Sitting and talking: 0    Sitting quielty after lunch: 0    In a car, stopped in traffic: 0   TOTAL SCORE:   3 out of  24    SLEEP STUDIES:  PSG (05/2013) AHI 83.7/hr, min SPO2 63% Titration (05/2013) CPAP@ 12 cmH2O   CPAP COMPLIANCE DATA:  Date Range: 09/26/2023-09/24/2024  Average Daily Use: 5 hours 35 minutes  Median Use: 5 hours 43 minutes  Compliance for > 4 Hours: 81%  AHI: 1.1 respiratory events per hour  Days Used: 345/365 days  Mask Leak: 83.7  95th Percentile Pressure: 13         LABS: No results found for this or any previous visit (from the past 2160 hours).  Radiology: US  Abdomen Limited RUQ (LIVER/GB) Result  Date: 01/08/2024 CLINICAL DATA:  fu hep B eval for liver cancer and cirhosis EXAM: ULTRASOUND ABDOMEN LIMITED RIGHT UPPER QUADRANT COMPARISON:  November 16, 2018 FINDINGS: Gallbladder: No gallstones are visualized. Similar gallbladder wall prominence, favored to reflect summation with adjacent pericholecystic fat. No sonographic Murphy sign noted by sonographer. Common bile duct: Diameter: Visualized portion measures 4 mm, within normal limits. Liver: No focal lesion identified. Heterogeneous and overall increased in parenchymal echogenicity. Portal vein is patent on color Doppler imaging with normal direction of blood flow towards the liver. Other: None. IMPRESSION: Heterogeneous and overall increased hepatic parenchymal echogenicity as can be seen in hepatic steatosis or underlying hepatocellular disease. No focal lesion identified. Electronically Signed   By: Corean Salter M.D.   On: 01/08/2024 09:51    No results found.  No results found.    Assessment and Plan: Patient Active Problem List   Diagnosis Date Noted   OSA (obstructive sleep apnea) 09/30/2024   CPAP use counseling 09/30/2024   Type 2 diabetes mellitus with hyperglycemia, without long-term current use of insulin (HCC) 03/03/2022   Hepatic steatosis 08/20/2019   Chronic hepatitis B virus infection (HCC) 09/20/2018   Back pain 09/12/2012   Hypertension 03/05/2012   Psoriasis 03/05/2012     1. OSA (obstructive sleep apnea) (Primary) The patient does tolerate PAP and reports  benefit from PAP use. He has lost 25 lbs and reports gassiness and bloating. We will reduce his pressure to 11 cm and do a 2 week download. The patient was reminded how to clean equipment and advised to replace supplies routinely. The patient was also counselled on weight loss. The compliance is very good . The AHI is 1.1.   OSA on cpap- controlled. Change pressure to 11 cm for gassiness. Continue with compliance with pap. CPAP continues to be medically necessary to treat this patient's OSA. F/u one year.     2. CPAP use counseling CPAP Counseling: had a lengthy discussion with the patient regarding the importance of PAP therapy in management of the sleep apnea. Patient appears to understand the risk factor reduction and also understands the risks associated with untreated sleep apnea. Patient will try to make a good faith effort to remain compliant with therapy. Also instructed the patient on proper cleaning of the device including the water must be changed daily if possible and use of distilled water is preferred. Patient understands that the machine should be regularly cleaned with appropriate recommended cleaning solutions that do not damage the PAP machine for example given white vinegar and water rinses. Other methods such as ozone treatment may not be as good as these simple methods to achieve cleaning.    General Counseling: I have discussed the findings of the evaluation and examination with Franky.  I have also discussed any further diagnostic evaluation thatmay be needed or ordered today. Mateus verbalizes understanding of the findings of todays visit. We also reviewed his medications today and discussed drug interactions and side effects including but not limited excessive drowsiness and altered mental states. We also discussed that there is always a risk not just to him but also people around him. he has  been encouraged to call the office with any questions or concerns that should arise related to todays visit.  No orders of the defined types were placed in this encounter.       I have personally obtained a history, examined the patient, evaluated laboratory and imaging results, formulated the assessment and plan and placed orders. This  patient was seen today by Lauraine Lay, PA-C in collaboration with Dr. Elfreda Bathe.   Elfreda DELENA Bathe, MD [facility] Diplomate ABMS Pulmonary Critical Care Medicine and Sleep Medicine

## 2024-09-30 ENCOUNTER — Ambulatory Visit: Admitting: Internal Medicine

## 2024-09-30 VITALS — BP 110/76 | HR 80 | Resp 16 | Ht 67.0 in | Wt 172.0 lb

## 2024-09-30 DIAGNOSIS — G4733 Obstructive sleep apnea (adult) (pediatric): Secondary | ICD-10-CM

## 2024-09-30 DIAGNOSIS — Z7189 Other specified counseling: Secondary | ICD-10-CM | POA: Insufficient documentation

## 2024-09-30 NOTE — Patient Instructions (Signed)

## 2024-10-01 LAB — COLOGUARD: COLOGUARD: NEGATIVE
